# Patient Record
Sex: Female | Born: 1995 | Race: White | Hispanic: No | Marital: Single | Smoking: Current some day smoker
Health system: Southern US, Community
[De-identification: ages and names within clinical notes are randomized; demographics above are authoritative.]

## PROBLEM LIST (undated history)

## (undated) DIAGNOSIS — Z3490 Encounter for supervision of normal pregnancy, unspecified, unspecified trimester: Secondary | ICD-10-CM

## (undated) DIAGNOSIS — F32A Depression, unspecified: Secondary | ICD-10-CM

## (undated) DIAGNOSIS — F329 Major depressive disorder, single episode, unspecified: Secondary | ICD-10-CM

## (undated) DIAGNOSIS — D649 Anemia, unspecified: Secondary | ICD-10-CM

## (undated) DIAGNOSIS — F419 Anxiety disorder, unspecified: Secondary | ICD-10-CM

## (undated) HISTORY — DX: Depression, unspecified: F32.A

## (undated) HISTORY — PX: OTHER SURGICAL HISTORY: SHX169

## (undated) HISTORY — PX: HIP SURGERY: SHX245

## (undated) HISTORY — DX: Anemia, unspecified: D64.9

---
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
## 2015-01-25 DIAGNOSIS — M25559 Pain in unspecified hip: Secondary | ICD-10-CM | POA: Insufficient documentation

## 2015-01-25 DIAGNOSIS — S73191A Other sprain of right hip, initial encounter: Secondary | ICD-10-CM | POA: Insufficient documentation

## 2015-02-22 DIAGNOSIS — M5459 Other low back pain: Secondary | ICD-10-CM | POA: Insufficient documentation

## 2015-05-15 DIAGNOSIS — M21952 Unspecified acquired deformity of left thigh: Secondary | ICD-10-CM | POA: Insufficient documentation

## 2015-07-30 DIAGNOSIS — E669 Obesity, unspecified: Secondary | ICD-10-CM | POA: Insufficient documentation

## 2015-07-30 DIAGNOSIS — Z8701 Personal history of pneumonia (recurrent): Secondary | ICD-10-CM | POA: Insufficient documentation

## 2015-07-30 DIAGNOSIS — O99210 Obesity complicating pregnancy, unspecified trimester: Secondary | ICD-10-CM | POA: Insufficient documentation

## 2015-08-06 DIAGNOSIS — D649 Anemia, unspecified: Secondary | ICD-10-CM | POA: Insufficient documentation

## 2015-08-06 DIAGNOSIS — Z9889 Other specified postprocedural states: Secondary | ICD-10-CM | POA: Insufficient documentation

## 2015-12-25 DIAGNOSIS — F338 Other recurrent depressive disorders: Secondary | ICD-10-CM | POA: Insufficient documentation

## 2015-12-25 DIAGNOSIS — F4001 Agoraphobia with panic disorder: Secondary | ICD-10-CM | POA: Insufficient documentation

## 2017-10-21 LAB — HM PAP SMEAR: HM Pap smear: NORMAL

## 2017-10-25 DIAGNOSIS — O09899 Supervision of other high risk pregnancies, unspecified trimester: Secondary | ICD-10-CM | POA: Insufficient documentation

## 2017-10-25 DIAGNOSIS — F1210 Cannabis abuse, uncomplicated: Secondary | ICD-10-CM | POA: Insufficient documentation

## 2017-10-31 ENCOUNTER — Encounter: Payer: Self-pay | Admitting: Emergency Medicine

## 2017-10-31 ENCOUNTER — Emergency Department
Admission: EM | Admit: 2017-10-31 | Discharge: 2017-10-31 | Disposition: A | Discharge status: Home / Self Care | Payer: Medicaid Other | Attending: Emergency Medicine | Admitting: Emergency Medicine

## 2017-10-31 ENCOUNTER — Other Ambulatory Visit: Payer: Self-pay

## 2017-10-31 DIAGNOSIS — Z3A12 12 weeks gestation of pregnancy: Secondary | ICD-10-CM | POA: Diagnosis not present

## 2017-10-31 DIAGNOSIS — R109 Unspecified abdominal pain: Secondary | ICD-10-CM

## 2017-10-31 DIAGNOSIS — R1084 Generalized abdominal pain: Secondary | ICD-10-CM | POA: Diagnosis not present

## 2017-10-31 DIAGNOSIS — O9989 Other specified diseases and conditions complicating pregnancy, childbirth and the puerperium: Secondary | ICD-10-CM | POA: Diagnosis present

## 2017-10-31 DIAGNOSIS — O26891 Other specified pregnancy related conditions, first trimester: Secondary | ICD-10-CM

## 2017-10-31 DIAGNOSIS — Z87891 Personal history of nicotine dependence: Secondary | ICD-10-CM | POA: Insufficient documentation

## 2017-10-31 HISTORY — DX: Anxiety disorder, unspecified: F41.9

## 2017-10-31 NOTE — ED Triage Notes (Signed)
Patient states that she is having lower abdominal cramping that started earlier today. Patient states that she is about [redacted] weeks pregnant.

## 2017-10-31 NOTE — ED Notes (Signed)
Provider in with patient at this time to do an ultrasound.

## 2017-10-31 NOTE — ED Provider Notes (Signed)
[facility] Emergency Department Provider Note   ____________________________________________    I have reviewed the triage vital signs and the nursing notes.   HISTORY  Chief Complaint Abdominal Pain     HPI Mckenzie Obrien is a 22 y.o. female who presents with complaints of lower abdominal cramping.  Patient reports she is about [redacted] weeks pregnant, she recently moved to the area, has not reestablish OB/GYN care however she has had a normal ultrasound several weeks ago.  She denies vaginal bleeding.  No nausea or vomiting.  She reports after eating today she developed some mild cramping in her abdomen and she became concerned about the pregnancy.  She thinks it may have started in response to something that she ate   Past Medical History:  Diagnosis Date  . Anxiety     There are no active problems to display for this patient.   Past Surgical History:  Procedure Laterality Date  . HIP SURGERY Right     Prior to Admission medications   Not on File     Allergies Patient has no known allergies.  No family history on file.  Social History Social History   Tobacco Use  . Smoking status: Former Games developer  . Smokeless tobacco: Never Used  Substance Use Topics  . Alcohol use: Not Currently  . Drug use: Not Currently    Types: Marijuana, Cocaine    Review of Systems  Constitutional: No fever/chills Eyes: No visual changes.  ENT: No sore throat. Cardiovascular: Denies chest pain. Respiratory: Denies shortness of breath. Gastrointestinal: As above Genitourinary: Negative for dysuria.  Vaginal discharge no bleeding Musculoskeletal: Negative for back pain. Skin: Negative for rash. Neurological: Negative for headaches    ____________________________________________   PHYSICAL EXAM:  VITAL SIGNS: ED Triage Vitals  Enc Vitals Group     BP 10/31/17 2031 121/71     Pulse Rate 10/31/17 2030 78     Resp 10/31/17 2030 18   Temp 10/31/17 2030 98.1 F (36.7 C)     Temp Source 10/31/17 2030 Oral     SpO2 10/31/17 2030 98 %     Weight 10/31/17 2030 89.8 kg (198 lb)     Height 10/31/17 2030 1.651 m (5\' 5" )     Head Circumference --      Peak Flow --      Pain Score 10/31/17 2030 6     Pain Loc --      Pain Edu? --      Excl. in GC? --     Constitutional: Alert and oriented. No acute distress. Pleasant and interactive   Mouth/Throat: Mucous membranes are moist.    Cardiovascular: Normal rate, regular rhythm. Grossly normal heart sounds.  Good peripheral circulation. Respiratory: Normal respiratory effort.  No retractions. Lungs CTAB. Gastrointestinal: Soft and nontender. No distention.  No CVA tenderness.  Musculoskeletal:   Warm and well perfused Neurologic:  Normal speech and language. No gross focal neurologic deficits are appreciated.  Skin:  Skin is warm, dry and intact. No rash noted. Psychiatric: Mood and affect are normal. Speech and behavior are normal.  ____________________________________________   LABS (all labs ordered are listed, but only abnormal results are displayed)  Labs Reviewed - No data to display ____________________________________________  EKG  None ____________________________________________  RADIOLOGY  EMBU: 12-week 2-day pregnancy, normal heart rate, moving ____________________________________________   PROCEDURES  Procedure(s) performed: No  Procedures   Critical Care performed: No ____________________________________________   INITIAL IMPRESSION / ASSESSMENT  AND PLAN / ED COURSE  Pertinent labs & imaging results that were available during my care of the patient were reviewed by me and considered in my medical decision making (see chart for details).  Patient quite reassured by ultrasound performed in the emergency department, no indication for further testing at this time, she feels well and has no symptoms in the emergency department.  Follow-up  with OB    ____________________________________________   FINAL CLINICAL IMPRESSION(S) / ED DIAGNOSES  Final diagnoses:  Abdominal pain in pregnancy, first trimester        Note:  This document was prepared using Dragon voice recognition software and may include unintentional dictation errors.    Jene Every, MD 10/31/17 [phone]

## 2018-01-21 DIAGNOSIS — F1911 Other psychoactive substance abuse, in remission: Secondary | ICD-10-CM | POA: Insufficient documentation

## 2018-03-16 ENCOUNTER — Other Ambulatory Visit: Payer: Self-pay

## 2018-03-16 ENCOUNTER — Ambulatory Visit
Admission: EM | Admit: 2018-03-16 | Discharge: 2018-03-16 | Disposition: A | Discharge status: Home / Self Care | Payer: Medicaid Other | Attending: Family Medicine | Admitting: Family Medicine

## 2018-03-16 ENCOUNTER — Encounter: Payer: Self-pay | Admitting: Emergency Medicine

## 2018-03-16 DIAGNOSIS — J00 Acute nasopharyngitis [common cold]: Secondary | ICD-10-CM | POA: Insufficient documentation

## 2018-03-16 DIAGNOSIS — Z87891 Personal history of nicotine dependence: Secondary | ICD-10-CM | POA: Diagnosis not present

## 2018-03-16 DIAGNOSIS — H6502 Acute serous otitis media, left ear: Secondary | ICD-10-CM | POA: Insufficient documentation

## 2018-03-16 LAB — RAPID INFLUENZA A&B ANTIGENS (ARMC ONLY): INFLUENZA A (ARMC): NEGATIVE

## 2018-03-16 LAB — RAPID STREP SCREEN (MED CTR MEBANE ONLY): STREPTOCOCCUS, GROUP A SCREEN (DIRECT): NEGATIVE

## 2018-03-16 LAB — RAPID INFLUENZA A&B ANTIGENS: Influenza B (ARMC): NEGATIVE

## 2018-03-16 MED ORDER — AMOXICILLIN 875 MG PO TABS: 875.0000 mg | ORAL_TABLET | Freq: Two times a day (BID) | ORAL | 0 refills | Status: DC

## 2018-03-16 NOTE — ED Triage Notes (Signed)
Patient in today c/o sore throat and bilateral ear fullness and pain x 4 days. Patient had fever yesterday of 101.2. Patient is [redacted] weeks pregnant.

## 2018-03-16 NOTE — ED Provider Notes (Signed)
[facility]    CSN: [id number] Arrival date & time: 03/16/18  0818     History   Chief Complaint Chief Complaint  Patient presents with  . Sore Throat  . Otalgia    HPI Mckenzie Obrien is a 23 y.o. female.   The history is provided by the patient.  URI  Presenting symptoms: congestion, ear pain and sore throat   Severity:  Moderate Onset quality:  Sudden Duration:  4 days Timing:  Constant Progression:  Unchanged Chronicity:  New Relieved by:  None tried Ineffective treatments:  None tried Associated symptoms: no wheezing   Risk factors: sick contacts   Risk factors: no recent travel     Past Medical History:  Diagnosis Date  . Anxiety     There are no active problems to display for this patient.   Past Surgical History:  Procedure Laterality Date  . HIP SURGERY Right     OB History    Gravida  1   Para      Term      Preterm      AB      Living        SAB      TAB      Ectopic      Multiple      Live Births               Home Medications    Prior to Admission medications   Medication Sig Start Date End Date Taking? Authorizing Provider  Prenatal Vit-DSS-Fe Fum-FA (PRENATAL 19) tablet Take by mouth. 09/21/17  Yes [provider]  amoxicillin (AMOXIL) 875 MG tablet Take 1 tablet (875 mg total) by mouth 2 (two) times daily. 03/16/18   Payton Mccallum, MD    Family History Family History  Problem Relation Age of Onset  . Healthy Mother   . Heart attack Father 25    Social History Social History   Tobacco Use  . Smoking status: Former Smoker    Last attempt to quit: 07/15/2017    Years since quitting: 0.6  . Smokeless tobacco: Never Used  Substance Use Topics  . Alcohol use: Not Currently  . Drug use: Not Currently    Types: Marijuana, Cocaine     Allergies   Patient has no known allergies.   Review of Systems Review of Systems  HENT: Positive for congestion, ear pain and sore throat.     Respiratory: Negative for wheezing.      Physical Exam Triage Vital Signs ED Triage Vitals  Enc Vitals Group     BP 03/16/18 0831 124/63     Pulse Rate 03/16/18 0831 (!) 104     Resp 03/16/18 0831 18     Temp 03/16/18 0831 97.8 F (36.6 C)     Temp Source 03/16/18 0831 Oral     SpO2 03/16/18 0831 100 %     Weight 03/16/18 0832 216 lb (98 kg)     Height 03/16/18 0832 5\' 5"  (1.651 m)     Head Circumference --      Peak Flow --      Pain Score 03/16/18 0832 6     Pain Loc --      Pain Edu? --      Excl. in GC? --    No data found.  Updated Vital Signs BP 124/63 (BP Location: Left Arm)   Pulse (!) 104   Temp 97.8 F (36.6 C) (Oral)  Resp 18   Ht 5\' 5"  (1.651 m)   Wt 98 kg Comment: [redacted] weeks pregnant  LMP 08/10/2017 (Approximate)   SpO2 100%   BMI 35.94 kg/m   Visual Acuity Right Eye Distance:   Left Eye Distance:   Bilateral Distance:    Right Eye Near:   Left Eye Near:    Bilateral Near:     Physical Exam Vitals signs and nursing note reviewed.  Constitutional:      General: She is not in acute distress.    Appearance: She is well-developed. She is not diaphoretic.  HENT:     Head: Normocephalic and atraumatic.     Right Ear: Tympanic membrane, ear canal and external ear normal.     Left Ear: Ear canal and external ear normal. Tympanic membrane is erythematous and bulging.     Nose: Congestion and rhinorrhea present.     Mouth/Throat:     Pharynx: Uvula midline. No oropharyngeal exudate.  Neck:     Musculoskeletal: Normal range of motion and neck supple.     Thyroid: No thyromegaly.  Cardiovascular:     Rate and Rhythm: Normal rate and regular rhythm.     Heart sounds: Normal heart sounds.  Pulmonary:     Effort: Pulmonary effort is normal. No respiratory distress.     Breath sounds: Normal breath sounds. No stridor. No wheezing, rhonchi or rales.  Lymphadenopathy:     Cervical: No cervical adenopathy.      UC Treatments / Results  Labs (all  labs ordered are listed, but only abnormal results are displayed) Labs Reviewed  RAPID STREP SCREEN ([facility] ONLY)  RAPID INFLUENZA A&B ANTIGENS ([facility] ONLY)  CULTURE, GROUP A STREP [facility])    EKG None  Radiology No results found.  Procedures Procedures (including critical care time)  Medications Ordered in UC Medications - No data to display  Initial Impression / Assessment and Plan / UC Course  I have reviewed the triage vital signs and the nursing notes.  Pertinent labs & imaging results that were available during my care of the patient were reviewed by me and considered in my medical decision making (see chart for details).      Final Clinical Impressions(s) / UC Diagnoses   Final diagnoses:  Acute serous otitis media of left ear, recurrence not specified  Acute nasopharyngitis    ED Prescriptions    Medication Sig Dispense Auth. Provider   amoxicillin (AMOXIL) 875 MG tablet Take 1 tablet (875 mg total) by mouth 2 (two) times daily. 14 tablet Payton Mccallum, MD     1. Lab results and diagnosis reviewed with patient 2. rx as per orders above; reviewed possible side effects, interactions, risks and benefits  3. Recommend supportive treatment with otc tylenol prn, salt water gargles 4. Follow-up prn if symptoms worsen or don't improve  Controlled Substance Prescriptions [place] Controlled Substance Registry consulted? Not Applicable   Payton Mccallum, MD 03/16/18 [phone]

## 2018-03-18 LAB — CULTURE, GROUP A STREP (THRC)

## 2018-07-13 DIAGNOSIS — F53 Postpartum depression: Secondary | ICD-10-CM | POA: Insufficient documentation

## 2018-08-31 DIAGNOSIS — M25352 Other instability, left hip: Secondary | ICD-10-CM | POA: Insufficient documentation

## 2018-09-17 DIAGNOSIS — Z975 Presence of (intrauterine) contraceptive device: Secondary | ICD-10-CM | POA: Insufficient documentation

## 2018-11-21 DIAGNOSIS — K257 Chronic gastric ulcer without hemorrhage or perforation: Secondary | ICD-10-CM | POA: Insufficient documentation

## 2018-11-24 ENCOUNTER — Ambulatory Visit
Admission: EM | Admit: 2018-11-24 | Discharge: 2018-11-24 | Disposition: A | Discharge status: Home / Self Care | Payer: Medicaid Other | Attending: Family Medicine | Admitting: Family Medicine

## 2018-11-24 ENCOUNTER — Other Ambulatory Visit: Payer: Self-pay

## 2018-11-24 DIAGNOSIS — W504XXA Accidental scratch by another person, initial encounter: Secondary | ICD-10-CM | POA: Diagnosis not present

## 2018-11-24 DIAGNOSIS — S0591XA Unspecified injury of right eye and orbit, initial encounter: Secondary | ICD-10-CM

## 2018-11-24 MED ORDER — POLYMYXIN B-TRIMETHOPRIM 10000-0.1 UNIT/ML-% OP SOLN: 1.0000 [drp] | Freq: Four times a day (QID) | OPHTHALMIC | 0 refills | Status: AC

## 2018-11-24 MED ORDER — KETOROLAC TROMETHAMINE 0.5 % OP SOLN: 1.0000 [drp] | Freq: Four times a day (QID) | OPHTHALMIC | 0 refills | Status: AC | PRN

## 2018-11-24 NOTE — ED Triage Notes (Signed)
Pt. States her infant scratched her & she got her eye, ever since it has been inflamed and irritated/red, this happened at 7am this morning.

## 2018-11-24 NOTE — ED Provider Notes (Signed)
[facility]    CSN: [id number] Arrival date & time: 11/24/18  1620  History   Chief Complaint Chief Complaint  Patient presents with   Eye Problem   HPI  23 year old female presents with the above complaint.  Patient reports that her daughter scratched her right eye this morning.  She believes that she suffered a corneal abrasion.  She states that her eye is red and irritated.  It is painful, 6/10 in severity.  Mild photophobia.  No visual disturbance.  No medications or interventions tried.  No reports of discharge.  No other associated symptoms.  No other complaints.  PMH, Surgical Hx, Family Hx, Social History reviewed and updated as below.  PMH: Depression    Migraine  DIAGNOSED  Anxiety    Substance abuse (CMS-HCC)    Former smoker    Poor intravenous access  "tiny rolling veins"  GERD (gastroesophageal reflux disease)  no current medications  Pneumonia and influenza 08/02/2015 Finished ABX 03-08-2015  Anesthesia complication  c-sec . epidural " wore off quicky" : no problems with right POA surgery, 2 years ago  Anemia 08/27/2013     Surgical Hx: OSTEOTOMY PELVIS 08/06/2015 Hip/Right Procedure: OSTEOTOMY, ILIAC, ACETABULAR OR INNOMINATE BONE; Surgeon: Lily KocherBrian David Lewis, MD; Location: [facility] OR; Service: Orthopedics; Laterality: Right;  Medical devices from this surgery are in the Implants section.   ARTHROSCOPY HIP 08/06/2015 Hip/Right Procedure: ARTHROSCOPY, HIP, SURGICAL; WITH LABRAL REPAIR; Surgeon: Lily KocherBrian David Lewis, MD; [facility]; Service: Orthopedics; Laterality: Right;  Medical devices from this surgery are in the Implants section.   INTRAOPERATIVE FLUOROSCOPY 08/06/2015 Hip/Right Procedure: FLUOROSCOPY, PHYSICIAN OR OTHER QUALIFIED HEALTH CARE PROFESSIONAL TIME MORE THAN 1 HOUR, ASSISTING A NONRADIOLOGIC PHYSICIAN OR OTHER QUALIFIED HEALTH CARE PROFESSIONAL; Surgeon: Lily KocherBrian David Lewis, MD; [facility]; Service: Orthopedics;  Laterality: Right;  Medical devices from this surgery are in the Implants section.   OSTEOTOMY PELVIS 09/16/2018 Hip/Left Procedure: OSTEOTOMY, ILIAC, ACETABULAR OR INNOMINATE BONE; Surgeon: Lily KocherLewis, Brian David, MD; Location: [facility] OR; Service: Orthopedics; Laterality: Left;  Medical devices from this surgery are in the Implants section.   ARTHROSCOPY HIP 09/16/2018 Hip/Left Procedure: ARTHROSCOPY, HIP, SURGICAL; WITH FEMOROPLASTY (IE, TREATMENT OF CAM LESION); Surgeon: Lily KocherLewis, Brian David, MD; [facility]; Service: Orthopedics; Laterality: Left;  Medical devices from this surgery are in the Implants section.   ARTHROSCOPY HIP 09/16/2018 Hip/Left Procedure: ARTHROSCOPY, HIP, SURGICAL; Surgeon: Lily KocherLewis, Brian David, MD; [facility]; Service: Orthopedics; Laterality: Left;  Medical devices from this surgery are in the Implants section.   INTRAOPERATIVE FLUOROSCOPY 09/16/2018 Hip/Left Procedure: FLUOROSCOPY; Surgeon: Lily KocherLewis, Brian David, MD; [facility]; Service: Orthopedics; Laterality: Left;  Medical devices from this surgery are in the Implants section.   UNLISTED ARTHROSCOPY PROCEDURE 09/16/2018 Hip/Left Procedure: hip capsular plication; Surgeon: Lily KocherLewis, Brian David, MD; [facility]; Service: Orthopedics; Laterality: Left;  Medical devices from this surgery are in the Implants section.       Home Medications    Prior to Admission medications   Medication Sig Start Date End Date Taking? Authorizing Provider  ketorolac (ACULAR) 0.5 % ophthalmic solution Place 1 drop into the right eye 4 (four) times daily as needed for up to 5 days (Pain). 11/24/18 11/29/18  Tommie Samsook, Annalia Metzger G, DO  Prenatal Vit-DSS-Fe Fum-FA (PRENATAL 19) tablet Take by mouth. 09/21/17   [provider]  trimethoprim-polymyxin b (POLYTRIM) ophthalmic solution Place 1 drop into the right eye every 6 (six) hours for 5 days. 11/24/18 11/29/18  Tommie Samsook, Westyn Driggers G, DO  Family History Alcohol abuse Father Awilda [place]    Lung cancer Maternal Grandfather    Breast cancer Maternal Grandmother    Diabetes Maternal Grandmother    Heart disease Maternal Grandmother    No Known Problems Mother    Breast cancer Paternal Grandmother      Social History Social History   Tobacco Use   Smoking status: Former Smoker    Quit date: 07/15/2017    Years since quitting: 1.3   Smokeless tobacco: Never Used  Substance Use Topics   Alcohol use: Not Currently   Drug use: Not Currently    Types: Marijuana, Cocaine   Allergies   Abilify [aripiprazole]   Review of Systems Review of Systems  Constitutional: Negative.   Eyes: Positive for photophobia, pain and redness. Negative for discharge and visual disturbance.   Physical Exam Triage Vital Signs ED Triage Vitals  Enc Vitals Group     BP 11/24/18 1653 115/84     Pulse Rate 11/24/18 1653 81     Resp 11/24/18 1653 19     Temp 11/24/18 1653 98.4 F (36.9 C)     Temp Source 11/24/18 1653 Oral     SpO2 11/24/18 1653 99 %     Weight 11/24/18 1650 230 lb (104.3 kg)     Height --      Head Circumference --      Peak Flow --      Pain Score 11/24/18 1650 6     Pain Loc --      Pain Edu? --      Excl. in [place]? --    Updated Vital Signs BP 115/84 (BP Location: Right Arm)    Pulse 81    Temp 98.4 F (36.9 C) (Oral)    Resp 19    Wt 104.3 kg    LMP 07/08/2018    SpO2 99%    Breastfeeding No    BMI 38.27 kg/m   Visual Acuity Right Eye Distance:   Left Eye Distance:   Bilateral Distance:    Right Eye Near:   Left Eye Near:    Bilateral Near:     Physical Exam Vitals signs and nursing note reviewed.  Constitutional:      General: She is not in acute distress.    Appearance: Normal appearance. She is not ill-appearing.  HENT:     Head: Normocephalic and atraumatic.     Nose: Nose normal. No congestion.  Eyes:     General:        Right eye: No discharge.        Left eye: No discharge.     Comments: Right - conjunctival injection.  Fluorescein negative.   Cardiovascular:     Rate and Rhythm: Normal rate and regular rhythm.     Heart sounds: No murmur.  Pulmonary:     Effort: Pulmonary effort is normal. No respiratory distress.  Neurological:     Mental Status: She is alert.  Psychiatric:        Mood and Affect: Mood normal.        Behavior: Behavior normal.    UC Treatments / Results  Labs (all labs ordered are listed, but only abnormal results are displayed) Labs Reviewed - No data to display  EKG   Radiology No results found.  Procedures Procedures (including critical care time)  Medications Ordered in UC Medications - No data to display  Initial Impression / Assessment and Plan / UC Course  I have  reviewed the triage vital signs and the nursing notes.  Pertinent labs & imaging results that were available during my care of the patient were reviewed by me and considered in my medical decision making (see chart for details).    23 year old female presents with an eye injury.  No abrasion noted on exam.  Placing empirically on Polytrim and ketorolac eyedrops.  Supportive care.  Final Clinical Impressions(s) / UC Diagnoses   Final diagnoses:  Right eye injury, initial encounter     Discharge Instructions     Medication as prescribed.  Take care  Dr. Adriana Simas    ED Prescriptions    Medication Sig Dispense Auth. Provider   trimethoprim-polymyxin b (POLYTRIM) ophthalmic solution Place 1 drop into the right eye every 6 (six) hours for 5 days. 10 mL Maysel Mccolm G, DO   ketorolac (ACULAR) 0.5 % ophthalmic solution Place 1 drop into the right eye 4 (four) times daily as needed for up to 5 days (Pain). 5 mL Tommie Sams, DO     PDMP not reviewed this encounter.   Tommie Sams, [place] 11/24/18 [phone]

## 2018-11-24 NOTE — Discharge Instructions (Signed)
Medication as prescribed.  Take care  Dr. Shalen Petrak  

## 2018-12-23 ENCOUNTER — Ambulatory Visit: Payer: Medicaid Other | Admitting: Family Medicine

## 2018-12-23 ENCOUNTER — Other Ambulatory Visit: Payer: Self-pay | Admitting: Family Medicine

## 2019-01-24 DIAGNOSIS — O99345 Other mental disorders complicating the puerperium: Secondary | ICD-10-CM | POA: Insufficient documentation

## 2019-01-24 DIAGNOSIS — G4700 Insomnia, unspecified: Secondary | ICD-10-CM | POA: Insufficient documentation

## 2019-01-24 DIAGNOSIS — G8928 Other chronic postprocedural pain: Secondary | ICD-10-CM | POA: Insufficient documentation

## 2019-01-24 DIAGNOSIS — M25552 Pain in left hip: Secondary | ICD-10-CM | POA: Insufficient documentation

## 2019-01-27 ENCOUNTER — Ambulatory Visit (INDEPENDENT_AMBULATORY_CARE_PROVIDER_SITE_OTHER): Payer: Medicaid Other | Admitting: Family Medicine

## 2019-01-27 ENCOUNTER — Other Ambulatory Visit: Payer: Self-pay

## 2019-01-27 ENCOUNTER — Encounter: Payer: Self-pay | Admitting: Family Medicine

## 2019-01-27 VITALS — BP 120/62 | HR 84 | Ht 65.0 in | Wt 231.0 lb

## 2019-01-27 DIAGNOSIS — Z7689 Persons encountering health services in other specified circumstances: Secondary | ICD-10-CM

## 2019-01-27 NOTE — Progress Notes (Signed)
Date:  01/27/2019   Name:  Mckenzie Obrien   DOB:  06-11-1995   MRN:  [id number]   Chief Complaint: Establish Care (just needs a pcp)  Patient is a 24 year old female who presents for a establishment of care exam. The patient reports the following problems: gastric ulcer. Health maintenance has been reviewed up to date.   No results found for: CREATININE, BUN, NA, K, CL, CO2 No results found for: CHOL, HDL, LDLCALC, LDLDIRECT, TRIG, CHOLHDL No results found for: TSH No results found for: HGBA1C   Review of Systems  Constitutional: Negative.  Negative for chills, fatigue, fever and unexpected weight change.  HENT: Negative for congestion, ear discharge, ear pain, rhinorrhea, sinus pressure, sneezing and sore throat.   Eyes: Negative for photophobia, pain, discharge, redness and itching.  Respiratory: Negative for cough, shortness of breath, wheezing and stridor.   Gastrointestinal: Negative for abdominal distention, abdominal pain, blood in stool, constipation, diarrhea, nausea and vomiting.  Endocrine: Negative for cold intolerance, heat intolerance, polydipsia, polyphagia and polyuria.  Genitourinary: Negative for dysuria, flank pain, frequency, hematuria, menstrual problem, pelvic pain, urgency, vaginal bleeding and vaginal discharge.  Musculoskeletal: Negative for arthralgias, back pain and myalgias.  Skin: Negative for rash.  Allergic/Immunologic: Negative for environmental allergies and food allergies.  Neurological: Negative for dizziness, weakness, light-headedness, numbness and headaches.  Hematological: Negative for adenopathy. Does not bruise/bleed easily.  Psychiatric/Behavioral: Negative for dysphoric mood. The patient is not nervous/anxious.     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 01/24/2019   Insomnia 01/24/2019   Postpartum anxiety 01/24/2019   Chronic gastric ulcer  without hemorrhage and without perforation 11/21/2018   IUD (intrauterine device) in place 09/17/2018   Postpartum depression (CODE) 07/13/2018   History of substance abuse (HCC) 01/21/2018   Mild tetrahydrocannabinol (THC) abuse 10/25/2017   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   Anemia 08/06/2015   History of pneumonia 07/30/2015   Obesity (BMI 30.0-34.9) 07/30/2015   Acquired deformity of hip, left 05/15/2015   History of depression 07/19/2013   History of marijuana use 07/19/2013   History of migraine 07/19/2013    Allergies  Allergen Reactions   Abilify [Aripiprazole]     Past Surgical History:  Procedure Laterality Date   hip reconstruction Bilateral    R) at 24 yrs old and L) at 52   HIP SURGERY Right     Social History   Tobacco Use   Smoking status: Former Smoker    Types: Cigarettes    Quit date: 07/15/2017    Years since quitting: 1.5   Smokeless tobacco: Never Used  Substance Use Topics   Alcohol use: Not Currently   Drug use: Not Currently    Types: Marijuana, Cocaine     Medication list has been reviewed and updated.  Current Meds  Medication Sig   buPROPion (WELLBUTRIN XL) 300 MG 24 hr tablet Take 1 tablet by mouth daily. psych   citalopram (CELEXA) 20 MG tablet Take 1.5 tablets by mouth daily. psych   diphenhydrAMINE (BENADRYL) 25 mg capsule Take 25 mg by mouth as needed. otc   ferrous sulfate 325 (65 FE) MG tablet Take 1 tablet by mouth every other day.   ondansetron (ZOFRAN) 4 MG tablet Take 4 mg by mouth every 6 (six) hours as needed.   pantoprazole (PROTONIX) 40 MG tablet Take 1 tablet by mouth daily.   sucralfate (  CARAFATE) 1 g tablet Take 1 tablet by mouth 4 (four) times daily.   traZODone (DESYREL) 50 MG tablet Take 0.5 tablets by mouth daily. psych    PHQ 2/9 Scores 01/27/2019  PHQ - 2 Score 1  PHQ- 9 Score 11    BP Readings from Last 3 Encounters:  01/27/19 120/62  11/24/18 115/84  03/16/18 124/63      Physical Exam Vitals and nursing note reviewed.  Constitutional:      General: She is not in acute distress.    Appearance: She is not diaphoretic.  HENT:     Head: Normocephalic and atraumatic.     Right Ear: External ear normal.     Left Ear: External ear normal.     Nose: Nose normal.  Eyes:     General:        Right eye: No discharge.        Left eye: No discharge.     Conjunctiva/sclera: Conjunctivae normal.     Pupils: Pupils are equal, round, and reactive to light.  Neck:     Thyroid: No thyromegaly.     Vascular: No JVD.  Cardiovascular:     Rate and Rhythm: Normal rate and regular rhythm.     Heart sounds: Normal heart sounds. No murmur. No friction rub. No gallop.   Pulmonary:     Effort: Pulmonary effort is normal.     Breath sounds: Normal breath sounds. No wheezing or rhonchi.  Abdominal:     General: Bowel sounds are normal.     Palpations: Abdomen is soft. There is no mass.     Tenderness: There is no abdominal tenderness. There is no guarding.  Musculoskeletal:        General: Normal range of motion.     Cervical back: Normal range of motion and neck supple.  Lymphadenopathy:     Cervical: No cervical adenopathy.  Skin:    General: Skin is warm and dry.  Neurological:     Mental Status: She is alert.     Deep Tendon Reflexes: Reflexes are normal and symmetric.     Wt Readings from Last 3 Encounters:  01/27/19 231 lb (104.8 kg)  11/24/18 230 lb (104.3 kg)  03/16/18 216 lb (98 kg)    BP 120/62    Pulse 84    Ht 5\' 5"  (1.651 m)    Wt 231 lb (104.8 kg)    BMI 38.44 kg/m   Assessment and Plan:  1. Establishing care with new doctor, encounter for No subjective/objective concerns noted during history and physical exam.  Previous physician encounters have been reviewed.  Patient is not requiring any medications.  Patient is now established and will continue with her psychiatrist for her medications including evaluation and treatment of ADHD.

## 2019-01-27 NOTE — Patient Instructions (Signed)

## 2019-06-06 ENCOUNTER — Ambulatory Visit: Payer: Medicaid Other | Admitting: Family Medicine

## 2019-08-11 ENCOUNTER — Telehealth: Payer: Self-pay | Admitting: Family Medicine

## 2019-08-11 NOTE — Telephone Encounter (Signed)
I called the pt yesterday and left a message concerning she needs to get ADD meds from her psych. She was supposed to call y'all back and let you know if she needs to cancel appt. I am assuming that is why she is calling back, to cancel. Please call

## 2019-08-11 NOTE — Telephone Encounter (Signed)
Patient stated she got a call regarding her medication.  She stated that the doctor said that she could not prescribe the type of meds. That the patient needs.  Please advise and call patient to discuss at [phone]

## 2019-08-15 ENCOUNTER — Other Ambulatory Visit: Payer: Self-pay

## 2019-08-15 ENCOUNTER — Encounter: Payer: Self-pay | Admitting: Family Medicine

## 2019-08-15 ENCOUNTER — Ambulatory Visit (INDEPENDENT_AMBULATORY_CARE_PROVIDER_SITE_OTHER): Payer: Medicaid Other | Admitting: Family Medicine

## 2019-08-15 VITALS — BP 128/64 | HR 68 | Ht 65.0 in | Wt 201.0 lb

## 2019-08-15 DIAGNOSIS — F331 Major depressive disorder, recurrent, moderate: Secondary | ICD-10-CM | POA: Diagnosis not present

## 2019-08-15 DIAGNOSIS — F419 Anxiety disorder, unspecified: Secondary | ICD-10-CM

## 2019-08-15 DIAGNOSIS — F900 Attention-deficit hyperactivity disorder, predominantly inattentive type: Secondary | ICD-10-CM | POA: Diagnosis not present

## 2019-08-15 NOTE — Progress Notes (Signed)
Date:  08/15/2019   Name:  Mckenzie Obrien   DOB:  Dec 24, 1995   MRN:  [id number]   Chief Complaint: ADHD (Referral to psych. Wants to go back to [facility]. ), Depression (19 PHQ9), and Anxiety (14 GAD7 )  Depression        This is a recurrent problem.  The current episode started 1 to 4 weeks ago.   The problem has been gradually worsening since onset.  Associated symptoms include decreased concentration, fatigue, helplessness, hopelessness, insomnia, irritable, restlessness, decreased interest, appetite change, myalgias and sad.  Associated symptoms include no headaches and no suicidal ideas.  Past treatments include nothing.  Past medical history includes anxiety.   Anxiety Presents for follow-up visit. Symptoms include decreased concentration, depressed mood, excessive worry, insomnia, irritability, nervous/anxious behavior and restlessness. Patient reports no chest pain, compulsions, confusion, dizziness, feeling of choking, malaise, muscle tension, nausea, shortness of breath or suicidal ideas. Symptoms occur most days.      No results found for: CREATININE, BUN, NA, K, CL, CO2 No results found for: CHOL, HDL, LDLCALC, LDLDIRECT, TRIG, CHOLHDL No results found for: TSH No results found for: HGBA1C No results found for: WBC, HGB, HCT, MCV, PLT No results found for: ALT, AST, GGT, ALKPHOS, BILITOT   Review of Systems  Constitutional: Positive for appetite change, fatigue and irritability. Negative for chills, fever and unexpected weight change.  HENT: Negative for congestion, ear discharge, ear pain, rhinorrhea, sinus pressure, sneezing and sore throat.   Eyes: Negative for photophobia, pain, discharge, redness and itching.  Respiratory: Negative for cough, shortness of breath, wheezing and stridor.   Cardiovascular: Negative for chest pain.  Gastrointestinal: Negative for abdominal pain, blood in stool, constipation, diarrhea, nausea and vomiting.  Endocrine: Negative  for cold intolerance, heat intolerance, polydipsia, polyphagia and polyuria.  Genitourinary: Negative for dysuria, flank pain, frequency, hematuria, menstrual problem, pelvic pain, urgency, vaginal bleeding and vaginal discharge.  Musculoskeletal: Positive for myalgias. Negative for arthralgias and back pain.  Skin: Negative for rash.  Allergic/Immunologic: Negative for environmental allergies and food allergies.  Neurological: Negative for dizziness, weakness, light-headedness, numbness and headaches.  Hematological: Negative for adenopathy. Does not bruise/bleed easily.  Psychiatric/Behavioral: Positive for decreased concentration and depression. Negative for confusion, dysphoric mood and suicidal ideas. The patient is nervous/anxious and has insomnia.     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 01/24/2019   Insomnia 01/24/2019   Postpartum anxiety 01/24/2019   Chronic gastric ulcer without hemorrhage and without perforation 11/21/2018   IUD (intrauterine device) in place 09/17/2018   Postpartum depression (CODE) 07/13/2018   History of substance abuse (HCC) 01/21/2018   Mild tetrahydrocannabinol (THC) abuse 10/25/2017   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   Anemia 08/06/2015   History of pneumonia 07/30/2015   Obesity (BMI 30.0-34.9) 07/30/2015   Acquired deformity of hip, left 05/15/2015   History of depression 07/19/2013   History of marijuana use 07/19/2013   History of migraine 07/19/2013    Allergies  Allergen Reactions   Abilify [Aripiprazole]     Past Surgical History:  Procedure Laterality Date   hip reconstruction Bilateral    R) at 24 yrs old and L) at 69   HIP SURGERY Right     Social History   Tobacco Use   Smoking status: Former Smoker    Types: Cigarettes    Quit date: 07/15/2017    Years  since quitting: 2.0   Smokeless tobacco: Never Used    Vaping Use   Vaping Use: Never used  Substance Use Topics   Alcohol use: Not Currently   Drug use: Not Currently    Types: Marijuana, Cocaine     Medication list has been reviewed and updated.  Current Meds  Medication Sig   diphenhydrAMINE (BENADRYL) 25 mg capsule Take 25 mg by mouth as needed. otc   ibuprofen (ADVIL) 400 MG tablet Take 400 mg by mouth every 6 (six) hours as needed. Wisdom tooth removal    PHQ 2/9 Scores 08/15/2019 01/27/2019  PHQ - 2 Score 4 1  PHQ- 9 Score 19 11    GAD 7 : Generalized Anxiety Score 08/15/2019 01/27/2019  Nervous, Anxious, on Edge 2 3  Control/stop worrying 2 3  Worry too much - different things 2 3  Trouble relaxing 2 2  Restless 2 0  Easily annoyed or irritable 3 3  Afraid - awful might happen 1 3  Total GAD 7 Score 14 17  Anxiety Difficulty Somewhat difficult Somewhat difficult    BP Readings from Last 3 Encounters:  08/15/19 128/64  01/27/19 120/62  11/24/18 115/84    Physical Exam Constitutional:      General: She is irritable. She is not in acute distress.    Appearance: She is not diaphoretic.  HENT:     Head: Normocephalic and atraumatic.     Right Ear: External ear normal.     Left Ear: External ear normal.     Nose: Nose normal.  Eyes:     General:        Right eye: No discharge.        Left eye: No discharge.     Conjunctiva/sclera: Conjunctivae normal.     Pupils: Pupils are equal, round, and reactive to light.  Neck:     Thyroid: No thyromegaly.     Vascular: No JVD.  Cardiovascular:     Rate and Rhythm: Normal rate and regular rhythm.     Heart sounds: Normal heart sounds. No murmur heard.  No friction rub. No gallop.   Pulmonary:     Effort: Pulmonary effort is normal.     Breath sounds: Normal breath sounds. No wheezing or rhonchi.  Abdominal:     General: Bowel sounds are normal.     Palpations: Abdomen is soft. There is no mass.     Tenderness: There is no abdominal tenderness. There is no  guarding.  Musculoskeletal:        General: Normal range of motion.     Cervical back: Normal range of motion and neck supple.  Lymphadenopathy:     Cervical: No cervical adenopathy.  Skin:    General: Skin is warm and dry.  Neurological:     Mental Status: She is alert.     Deep Tendon Reflexes: Reflexes are normal and symmetric.     Wt Readings from Last 3 Encounters:  08/15/19 201 lb (91.2 kg)  01/27/19 231 lb (104.8 kg)  11/24/18 230 lb (104.3 kg)    BP 128/64    Pulse 68    Ht 5\' 5"  (1.651 m)    Wt 201 lb (91.2 kg)    SpO2 98%    BMI 33.45 kg/m   Assessment and Plan:  1. Moderate episode of recurrent major depressive disorder (HCC) Chronic.  Uncontrolled.  Stable.  PHQ 19.  Patient is referred to psychiatry for evaluation and treatment. - Ambulatory referral  to Psychiatry  2. Anxiety Chronic.  Uncontrolled.  Stable.  Gad score is 14.  Patient is referred to psychiatry for evaluation and treatment. - Ambulatory referral to Psychiatry  3. Attention deficit hyperactivity disorder (ADHD), predominantly inattentive type Chronic.  Uncontrolled.  Patient used to be on Adderall 15 mg.  Patient will be referred to psychiatry for evaluation and treatment if necessary. - Ambulatory referral to Psychiatry

## 2020-04-02 ENCOUNTER — Ambulatory Visit
Admission: EM | Admit: 2020-04-02 | Discharge: 2020-04-02 | Disposition: A | Discharge status: Home / Self Care | Payer: Medicaid Other

## 2020-04-02 ENCOUNTER — Other Ambulatory Visit: Payer: Self-pay

## 2020-04-02 DIAGNOSIS — H66003 Acute suppurative otitis media without spontaneous rupture of ear drum, bilateral: Secondary | ICD-10-CM | POA: Diagnosis not present

## 2020-04-02 DIAGNOSIS — J069 Acute upper respiratory infection, unspecified: Secondary | ICD-10-CM | POA: Diagnosis not present

## 2020-04-02 MED ORDER — AMOXICILLIN-POT CLAVULANATE 875-125 MG PO TABS: 1.0000 | ORAL_TABLET | Freq: Two times a day (BID) | ORAL | 0 refills | Status: AC

## 2020-04-02 MED ORDER — BENZONATATE 100 MG PO CAPS: 200.0000 mg | ORAL_CAPSULE | Freq: Three times a day (TID) | ORAL | 0 refills | Status: DC

## 2020-04-02 MED ORDER — PROMETHAZINE-DM 6.25-15 MG/5ML PO SYRP: 5.0000 mL | ORAL_SOLUTION | Freq: Four times a day (QID) | ORAL | 0 refills | Status: DC | PRN

## 2020-04-02 MED ORDER — IPRATROPIUM BROMIDE 0.06 % NA SOLN: 2.0000 | Freq: Four times a day (QID) | NASAL | 12 refills | Status: DC

## 2020-04-02 NOTE — ED Provider Notes (Signed)
[facility]    CSN: [id number] Arrival date & time: 04/02/20  1710      History   Chief Complaint Chief Complaint  Patient presents with  . Cough  . Otalgia    HPI Mckenzie Obrien is a 25 y.o. female.   HPI   25 year old female here for evaluation of cough, nasal congestion, and ear fullness.  Patient reports that she has been experiencing ED symptoms for the last several days.  She has had a runny nose with clear discharge and significant nasal congestion.  Patient denies fever, sore throat, headache, wheezing, or GI complaints.  Past Medical History:  Diagnosis Date  . Anemia   . Anxiety   . Depression     Patient Active Problem List   Diagnosis Date Noted  . 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 Insomnia 01/24/2019  . Postpartum anxiety 01/24/2019  . Chronic gastric ulcer without hemorrhage and without perforation 11/21/2018  . IUD (intrauterine device) in place 09/17/2018  . Postpartum depression (CODE) 07/13/2018  . History of substance abuse (HCC) 01/21/2018  . Mild tetrahydrocannabinol (THC) abuse 10/25/2017  . 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  . Anemia 08/06/2015  . History of pneumonia 07/30/2015  . Obesity (BMI 30.0-34.9) 07/30/2015  . Acquired deformity of hip, left 05/15/2015  . History of depression 07/19/2013  . History of marijuana use 07/19/2013  . History of migraine 07/19/2013    Past Surgical History:  Procedure Laterality Date  . hip reconstruction Bilateral    R) at 25 yrs old and L) at 89  . HIP SURGERY Right     OB History    Gravida  1   Para      Term      Preterm      AB      Living        SAB      IAB      Ectopic      Multiple      Live Births               Home Medications    Prior to Admission medications   Medication Sig Start Date End Date Taking? Authorizing Provider  AKLIEF 0.005 % CREA Apply 1 application topically  at bedtime. 02/10/20  Yes [provider]  amoxicillin-clavulanate (AUGMENTIN) 875-125 MG tablet Take 1 tablet by mouth every 12 (twelve) hours for 10 days. 04/02/20 04/12/20 Yes Becky Augusta, NP  benzonatate (TESSALON) 100 MG capsule Take 2 capsules (200 mg total) by mouth every 8 (eight) hours. 04/02/20  Yes Becky Augusta, NP  DORYX MPC 120 MG TBEC Take 1 tablet by mouth daily. 03/15/20  Yes [provider]  ipratropium (ATROVENT) 0.06 % nasal spray Place 2 sprays into both nostrils 4 (four) times daily. 04/02/20  Yes Becky Augusta, NP  promethazine-dextromethorphan (PROMETHAZINE-DM) 6.25-15 MG/5ML syrup Take 5 mLs by mouth 4 (four) times daily as needed. 04/02/20  Yes Becky Augusta, NP  spironolactone (ALDACTONE) 100 MG tablet Take 100 mg by mouth daily. 02/09/20  Yes [provider]  WINLEVI 1 % CREA 1(ONE) APPLICATION(S) TOPICAL EVERY NIGHT AT BEDTIME APPLY FIRST 03/29/20  Yes [provider]  diphenhydrAMINE (BENADRYL) 25 mg capsule Take 25 mg by mouth as needed. otc    [provider]  ibuprofen (ADVIL) 400 MG tablet Take 400 mg by mouth every 6 (six) hours as needed. Wisdom tooth removal    [provider]    Family History Family History  Problem Relation Age of Onset  . Healthy Mother   . Heart attack Father 29  . Diabetes Maternal Grandmother   . Heart disease Maternal Grandmother   . Stroke Maternal Grandfather   . Heart disease Brother     Social History Social History   Tobacco Use  . Smoking status: Former Smoker    Types: Cigarettes    Quit date: 07/15/2017    Years since quitting: 2.7  . Smokeless tobacco: Never Used  Vaping Use  . Vaping Use: Never used  Substance Use Topics  . Alcohol use: Not Currently  . Drug use: Not Currently    Types: Marijuana, Cocaine     Allergies   Abilify [aripiprazole]   Review of Systems Review of Systems  Constitutional: Negative for activity change, appetite change and fever.   HENT: Positive for congestion, ear pain and rhinorrhea. Negative for ear discharge and sore throat.   Respiratory: Positive for cough. Negative for shortness of breath and wheezing.   Gastrointestinal: Negative for diarrhea, nausea and vomiting.  Musculoskeletal: Negative for arthralgias and myalgias.  Hematological: Negative.   Psychiatric/Behavioral: Negative.      Physical Exam Triage Vital Signs ED Triage Vitals  Enc Vitals Group     BP 04/02/20 1747 117/76     Pulse Rate 04/02/20 1747 84     Resp 04/02/20 1747 18     Temp 04/02/20 1747 97.8 F (36.6 C)     Temp Source 04/02/20 1747 Oral     SpO2 04/02/20 1747 100 %     Weight 04/02/20 1745 220 lb (99.8 kg)     Height 04/02/20 1745 5\' 5"  (1.651 m)     Head Circumference --      Peak Flow --      Pain Score 04/02/20 1744 7     Pain Loc --      Pain Edu? --      Excl. in GC? --    No data found.  Updated Vital Signs BP 117/76 (BP Location: Left Arm)   Pulse 84   Temp 97.8 F (36.6 C) (Oral)   Resp 18   Ht 5\' 5"  (1.651 m)   Wt 220 lb (99.8 kg)   SpO2 100%   BMI 36.61 kg/m   Visual Acuity Right Eye Distance:   Left Eye Distance:   Bilateral Distance:    Right Eye Near:   Left Eye Near:    Bilateral Near:     Physical Exam Vitals and nursing note reviewed.  Constitutional:      General: She is not in acute distress.    Appearance: Normal appearance. She is not ill-appearing.  HENT:     Head: Normocephalic and atraumatic.     Right Ear: Ear canal and external ear normal.     Left Ear: Ear canal and external ear normal.     Nose: Congestion present.     Mouth/Throat:     Mouth: Mucous membranes are moist.     Pharynx: Oropharynx is clear. No posterior oropharyngeal erythema.  Cardiovascular:     Rate and Rhythm: Normal rate and regular rhythm.     Pulses: Normal pulses.     Heart sounds: Normal heart sounds. No murmur heard. No gallop.   Pulmonary:     Effort: Pulmonary effort is normal.      Breath sounds: Normal breath sounds. No wheezing, rhonchi or rales.  Skin:  General: Skin is warm and dry.     Capillary Refill: Capillary refill takes less than 2 seconds.     Findings: No erythema or rash.  Neurological:     General: No focal deficit present.     Mental Status: She is alert and oriented to person, place, and time.  Psychiatric:        Mood and Affect: Mood normal.        Behavior: Behavior normal.        Thought Content: Thought content normal.        Judgment: Judgment normal.      UC Treatments / Results  Labs (all labs ordered are listed, but only abnormal results are displayed) Labs Reviewed - No data to display  EKG   Radiology No results found.  Procedures Procedures (including critical care time)  Medications Ordered in UC Medications - No data to display  Initial Impression / Assessment and Plan / UC Course  I have reviewed the triage vital signs and the nursing notes.  Pertinent labs & imaging results that were available during my care of the patient were reviewed by me and considered in my medical decision making (see chart for details).   Pleasant 25 year old female here for evaluation of cold symptoms that started 2 to 3 days ago.  Physical exam reveals bilateral erythematous and injected tympanic membranes with clear external auditory canals.  Nasal mucosa is erythematous edematous with copious clear nasal discharge.  Posterior oropharynx is unremarkable.  Lungs are clear to auscultation all fields.  Patient's physical exam is consistent with upper respiratory infection and bilateral otitis media.  Will treat otitis media with Augmentin twice daily for 10 days, provide ipratropium nasal spray for nasal congestion and runny nose, Tessalon Perles for cough during the day and Promethazine DM for cough at bedtime.     Final Clinical Impressions(s) / UC Diagnoses   Final diagnoses:  Viral URI with cough  Non-recurrent acute suppurative  otitis media of both ears without spontaneous rupture of tympanic membranes     Discharge Instructions     The Augmentin twice daily for 10 days for treatment of ear infections.  Take this with food.  Use the ipratropium nasal spray, 2 squirts in each nostril every 6 hours, as needed for nasal congestion runny nose.  Use the Tessalon Perles during the day for cough and the Promethazine DM at bedtime for cough and congestion.  Return for reevaluation for new or worsening symptoms.    ED Prescriptions    Medication Sig Dispense Auth. Provider   amoxicillin-clavulanate (AUGMENTIN) 875-125 MG tablet Take 1 tablet by mouth every 12 (twelve) hours for 10 days. 20 tablet Becky Augusta, NP   ipratropium (ATROVENT) 0.06 % nasal spray Place 2 sprays into both nostrils 4 (four) times daily. 15 mL Becky Augusta, NP   benzonatate (TESSALON) 100 MG capsule Take 2 capsules (200 mg total) by mouth every 8 (eight) hours. 21 capsule Becky Augusta, NP   promethazine-dextromethorphan (PROMETHAZINE-DM) 6.25-15 MG/5ML syrup Take 5 mLs by mouth 4 (four) times daily as needed. 118 mL Becky Augusta, NP     PDMP not reviewed this encounter.   Becky Augusta, NP 04/02/20 [phone]

## 2020-04-02 NOTE — ED Triage Notes (Signed)
Pt c/o cough, nasal congestion, ear congestion (R worse) and pain for several days. Pt denies f/n/v/d or other symptoms. She has used nasal sprays and cold/flu medications with little improvement.

## 2020-04-02 NOTE — Discharge Instructions (Addendum)
The Augmentin twice daily for 10 days for treatment of ear infections.  Take this with food.  Use the ipratropium nasal spray, 2 squirts in each nostril every 6 hours, as needed for nasal congestion runny nose.  Use the Tessalon Perles during the day for cough and the Promethazine DM at bedtime for cough and congestion.  Return for reevaluation for new or worsening symptoms.

## 2020-04-04 ENCOUNTER — Ambulatory Visit
Admission: EM | Admit: 2020-04-04 | Discharge: 2020-04-04 | Disposition: A | Discharge status: Home / Self Care | Payer: Medicaid Other | Attending: Physician Assistant | Admitting: Physician Assistant

## 2020-04-04 ENCOUNTER — Other Ambulatory Visit: Payer: Self-pay

## 2020-04-04 DIAGNOSIS — S0502XA Injury of conjunctiva and corneal abrasion without foreign body, left eye, initial encounter: Secondary | ICD-10-CM

## 2020-04-04 DIAGNOSIS — H5712 Ocular pain, left eye: Secondary | ICD-10-CM

## 2020-04-04 MED ORDER — CIPROFLOXACIN HCL 0.3 % OP SOLN: 2.0000 [drp] | OPHTHALMIC | 0 refills | Status: AC

## 2020-04-04 MED ORDER — KETOROLAC TROMETHAMINE 0.5 % OP SOLN: 1.0000 [drp] | Freq: Four times a day (QID) | OPHTHALMIC | 0 refills | Status: AC

## 2020-04-04 NOTE — ED Provider Notes (Signed)
[facility]    CSN: [id number] Arrival date & time: 04/04/20  1441      History   Chief Complaint Chief Complaint  Patient presents with  . Eye Pain    HPI Mckenzie Obrien is a 25 y.o. female presenting for left eye pain since yesterday.  Patient states that her 11-year-old scratched her in the eye.  She has had left-sided eye redness, watering of the eye, photophobia since.  Patient wears glasses occasionally and denies use of contacts.  She has not really had any change in vision but says that she is not really been keeping her eye open since she was sensitive to the light.  Denies any headaches, dizziness, nausea/vomiting.  Has not applied any over-the-counter eyedrops or take anything for pain relief.  No other concerns.  HPI  Past Medical History:  Diagnosis Date  . Anemia   . Anxiety   . Depression     Patient Active Problem List   Diagnosis Date Noted  . 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 Insomnia 01/24/2019  . Postpartum anxiety 01/24/2019  . Chronic gastric ulcer without hemorrhage and without perforation 11/21/2018  . IUD (intrauterine device) in place 09/17/2018  . Postpartum depression (CODE) 07/13/2018  . History of substance abuse (HCC) 01/21/2018  . Mild tetrahydrocannabinol (THC) abuse 10/25/2017  . 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  . Anemia 08/06/2015  . History of pneumonia 07/30/2015  . Obesity (BMI 30.0-34.9) 07/30/2015  . Acquired deformity of hip, left 05/15/2015  . History of depression 07/19/2013  . History of marijuana use 07/19/2013  . History of migraine 07/19/2013    Past Surgical History:  Procedure Laterality Date  . hip reconstruction Bilateral    R) at 25 yrs old and L) at 35  . HIP SURGERY Right     OB History    Gravida  1   Para      Term      Preterm      AB      Living        SAB      IAB      Ectopic      Multiple      Live  Births               Home Medications    Prior to Admission medications   Medication Sig Start Date End Date Taking? Authorizing Provider  ciprofloxacin (CILOXAN) 0.3 % ophthalmic solution Place 2 drops into the left eye every 4 (four) hours while awake for 5 days. Administer 1 drop, every 2 hours, while awake, for 2 days. Then 1 drop, every 4 hours, while awake, for the next 5 days. 04/04/20 04/09/20 Yes Shirlee Latch, PA-C  ketorolac (ACULAR) 0.5 % ophthalmic solution Place 1 drop into the left eye every 6 (six) hours for 5 days. 04/04/20 04/09/20 Yes Eusebio Friendly B, PA-C  AKLIEF 0.005 % CREA Apply 1 application topically at bedtime. 02/10/20   [provider]  amoxicillin-clavulanate (AUGMENTIN) 875-125 MG tablet Take 1 tablet by mouth every 12 (twelve) hours for 10 days. 04/02/20 04/12/20  Becky Augusta, NP  benzonatate (TESSALON) 100 MG capsule Take 2 capsules (200 mg total) by mouth every 8 (eight) hours. 04/02/20   Becky Augusta, NP  diphenhydrAMINE (BENADRYL) 25 mg capsule Take 25 mg by mouth as needed. otc    [provider]  DORYX MPC 120 MG TBEC Take 1  tablet by mouth daily. 03/15/20   [provider]  ibuprofen (ADVIL) 400 MG tablet Take 400 mg by mouth every 6 (six) hours as needed. Wisdom tooth removal    [provider]  ipratropium (ATROVENT) 0.06 % nasal spray Place 2 sprays into both nostrils 4 (four) times daily. 04/02/20   Becky Augusta, NP  promethazine-dextromethorphan (PROMETHAZINE-DM) 6.25-15 MG/5ML syrup Take 5 mLs by mouth 4 (four) times daily as needed. 04/02/20   Becky Augusta, NP  spironolactone (ALDACTONE) 100 MG tablet Take 100 mg by mouth daily. 02/09/20   [provider]  WINLEVI 1 % CREA 1(ONE) APPLICATION(S) TOPICAL EVERY NIGHT AT BEDTIME APPLY FIRST 03/29/20   [provider]    Family History Family History  Problem Relation Age of Onset  . Healthy Mother   . Heart attack Father 79  . Diabetes Maternal  Grandmother   . Heart disease Maternal Grandmother   . Stroke Maternal Grandfather   . Heart disease Brother     Social History Social History   Tobacco Use  . Smoking status: Former Smoker    Types: Cigarettes    Quit date: 07/15/2017    Years since quitting: 2.7  . Smokeless tobacco: Never Used  Vaping Use  . Vaping Use: Never used  Substance Use Topics  . Alcohol use: Yes    Comment: occ  . Drug use: Not Currently    Types: Marijuana, Cocaine     Allergies   Abilify [aripiprazole]   Review of Systems Review of Systems  Eyes: Positive for photophobia, pain, redness and visual disturbance. Negative for discharge and itching.  Gastrointestinal: Negative for nausea and vomiting.  Skin: Negative for wound.  Neurological: Negative for dizziness, weakness, light-headedness and headaches.     Physical Exam Triage Vital Signs ED Triage Vitals  Enc Vitals Group     BP 04/04/20 1452 122/82     Pulse Rate 04/04/20 1452 92     Resp 04/04/20 1452 18     Temp 04/04/20 1452 98.5 F (36.9 C)     Temp Source 04/04/20 1452 Oral     SpO2 04/04/20 1452 98 %     Weight 04/04/20 1453 220 lb (99.8 kg)     Height 04/04/20 1453 5\' 5"  (1.651 m)     Head Circumference --      Peak Flow --      Pain Score 04/04/20 1452 4     Pain Loc --      Pain Edu? --      Excl. in GC? --    No data found.  Updated Vital Signs BP 122/82   Pulse 92   Temp 98.5 F (36.9 C) (Oral)   Resp 18   Ht 5\' 5"  (1.651 m)   Wt 220 lb (99.8 kg)   SpO2 98%   BMI 36.61 kg/m   Visual Acuity Right Eye Distance: 20/25 Left Eye Distance: 20/40 Bilateral Distance: 20/30  Right Eye Near:   Left Eye Near:    Bilateral Near:     Physical Exam Vitals and nursing note reviewed.  Constitutional:      General: She is not in acute distress.    Appearance: Normal appearance. She is not ill-appearing or toxic-appearing.  HENT:     Head: Normocephalic and atraumatic.     Nose: Nose normal.      Mouth/Throat:     Mouth: Mucous membranes are moist.     Pharynx: Oropharynx is clear.  Eyes:  General: Lids are normal. No scleral icterus.       Right eye: No discharge.        Left eye: No discharge.     Conjunctiva/sclera:     Left eye: Left conjunctiva is injected.     Pupils: Pupils are equal, round, and reactive to light.     Left eye: Corneal abrasion (small corneal abrasion ~12 o'clock) present.     Slit lamp exam:    Left eye: Photophobia present.  Cardiovascular:     Rate and Rhythm: Normal rate and regular rhythm.  Pulmonary:     Effort: Pulmonary effort is normal. No respiratory distress.  Musculoskeletal:     Cervical back: Neck supple.  Skin:    General: Skin is dry.  Neurological:     General: No focal deficit present.     Mental Status: She is alert. Mental status is at baseline.     Motor: No weakness.     Gait: Gait normal.  Psychiatric:        Mood and Affect: Mood normal.        Behavior: Behavior normal.        Thought Content: Thought content normal.      UC Treatments / Results  Labs (all labs ordered are listed, but only abnormal results are displayed) Labs Reviewed - No data to display  EKG   Radiology No results found.  Procedures Procedures (including critical care time)  Medications Ordered in UC Medications - No data to display  Initial Impression / Assessment and Plan / UC Course  I have reviewed the triage vital signs and the nursing notes.  Pertinent labs & imaging results that were available during my care of the patient were reviewed by me and considered in my medical decision making (see chart for details).   25 y/o female presenting for left eye pain, redness, photophobia following a scratch of the eye by her 25 year old.   Exam reveals corneal abrasion. Treating supportively with Ciloxan and ketorolac ophthalmic solution. Return and ED precautions discussed.    Final Clinical Impressions(s) / UC Diagnoses   Final  diagnoses:  Abrasion of left cornea, initial encounter  Pain of left eye     Discharge Instructions     You have a small abrasion of your cornea.  I have prescribed an antibiotic eyedrop to help prevent infection but if you notice any discolored drainage from the eye or increased eye pain you need to be seen again, preferably by an eye specialist.  I have sent ketorolac eyedrops.  This will help with pain relief.  You can also take oral ibuprofen and/or Tylenol.  Supportive care.  If the light bothers her eyes and keep the lights dim or worse then changed.  Follow-up with [place] as needed.    ED Prescriptions    Medication Sig Dispense Auth. Provider   ciprofloxacin (CILOXAN) 0.3 % ophthalmic solution Place 2 drops into the left eye every 4 (four) hours while awake for 5 days. Administer 1 drop, every 2 hours, while awake, for 2 days. Then 1 drop, every 4 hours, while awake, for the next 5 days. 5 mL Eusebio Friendly B, PA-C   ketorolac (ACULAR) 0.5 % ophthalmic solution Place 1 drop into the left eye every 6 (six) hours for 5 days. 5 mL Shirlee Latch, PA-C     PDMP not reviewed this encounter.   Shirlee Latch, PA-C 04/04/20 1621

## 2020-04-04 NOTE — Discharge Instructions (Signed)
You have a small abrasion of your cornea.  I have prescribed an antibiotic eyedrop to help prevent infection but if you notice any discolored drainage from the eye or increased eye pain you need to be seen again, preferably by an eye specialist.  I have sent ketorolac eyedrops.  This will help with pain relief.  You can also take oral ibuprofen and/or Tylenol.  Supportive care.  If the light bothers her eyes and keep the lights dim or worse then changed.  Follow-up with [place] as needed.

## 2020-04-04 NOTE — ED Triage Notes (Signed)
Pt c/o L eye pain and swelling. sts daughter scratched her in the eye last night. sts there has been some draining from eye.

## 2020-06-14 ENCOUNTER — Ambulatory Visit: Payer: Medicaid Other | Admitting: Family Medicine

## 2020-06-15 ENCOUNTER — Other Ambulatory Visit: Payer: Self-pay | Admitting: Otolaryngology

## 2020-06-15 DIAGNOSIS — H903 Sensorineural hearing loss, bilateral: Secondary | ICD-10-CM

## 2020-06-15 DIAGNOSIS — H93A2 Pulsatile tinnitus, left ear: Secondary | ICD-10-CM

## 2020-06-20 ENCOUNTER — Ambulatory Visit: Payer: Medicaid Other | Admitting: Family Medicine

## 2020-06-28 ENCOUNTER — Ambulatory Visit
Admission: RE | Admit: 2020-06-28 | Discharge: 2020-06-28 | Disposition: A | Discharge status: Home / Self Care | Payer: Medicaid Other | Source: Ambulatory Visit | Attending: Otolaryngology | Admitting: Otolaryngology

## 2020-06-28 ENCOUNTER — Other Ambulatory Visit: Payer: Self-pay

## 2020-06-28 DIAGNOSIS — H93A2 Pulsatile tinnitus, left ear: Secondary | ICD-10-CM | POA: Diagnosis not present

## 2020-06-28 DIAGNOSIS — H903 Sensorineural hearing loss, bilateral: Secondary | ICD-10-CM | POA: Insufficient documentation

## 2020-06-28 IMAGING — MR MR BRAIN/IAC WO/W CM
10 of 14 series · 28 of 48 positions shown · IV contrast (gadavist)
Comparison: No pertinent prior exams available for comparison.

CLINICAL DATA: Pulsatile tinnitus of left ear. Sensory hearing
loss, bilateral. Additional history provided by scanning
technologist: Patient reports constant "whooshing" in left ear for 4
months following ear/sinus infection, decreased hearing in left ear.

EXAM:
MRI HEAD WITHOUT AND WITH CONTRAST
TECHNIQUE: Multiplanar, multiecho pulse sequences of the brain and surrounding
structures were obtained without and with intravenous contrast.
CONTRAST:  10mL GADAVIST GADOBUTROL 1 MMOL/ML IV SOLN

[Series 5: T1 · sagittal · 5.0mm · 0.62mm/px · 3 of 25 slices shown (1 of 3)]
[im 1/25]
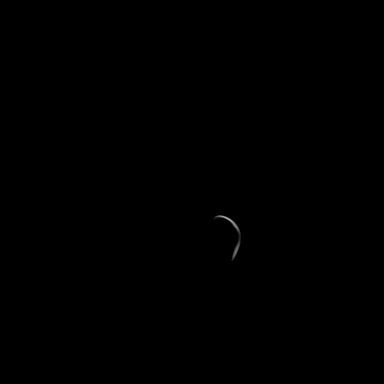
[im 13/25]
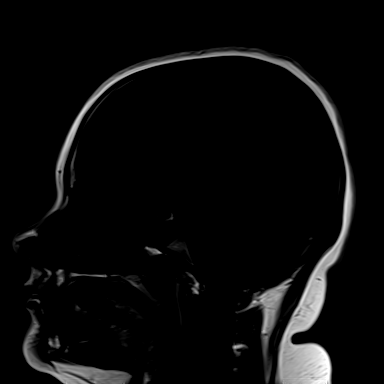
[im 25/25]
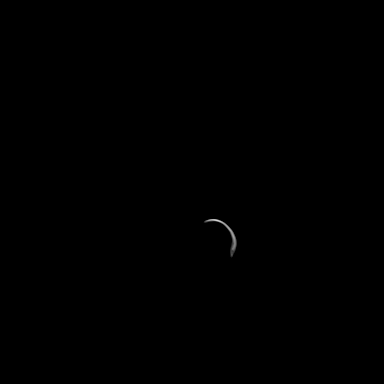

[Series 6: ax dwi_tracew · axial · 3.0mm · 0.65mm/px · z∈[-138,+11]mm · 3 of 48 slices shown]
[im 1/48]
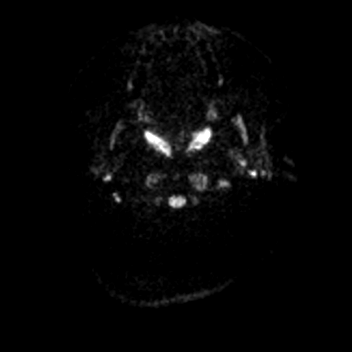
[im 24/48]
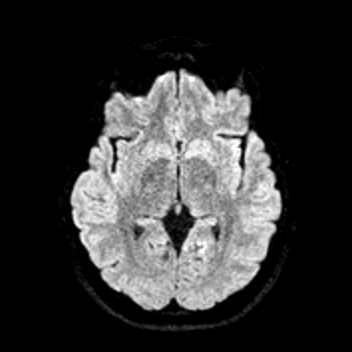
[im 48/48]
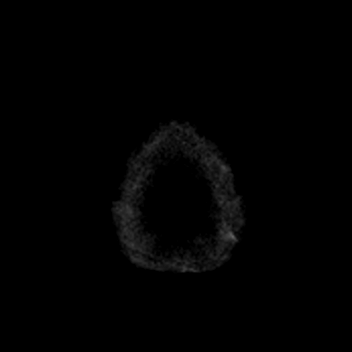

[Series 7: ax dwi_adc · axial · 3.0mm · 0.65mm/px · z∈[-138,+11]mm · 3 of 48 slices shown]
[im 1/48]
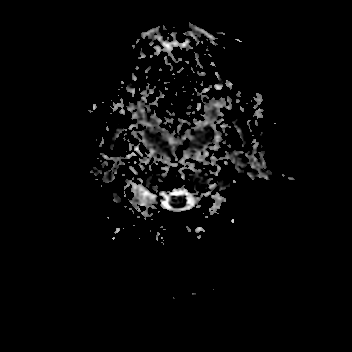
[im 24/48]
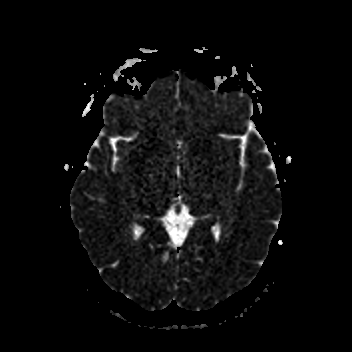
[im 48/48]
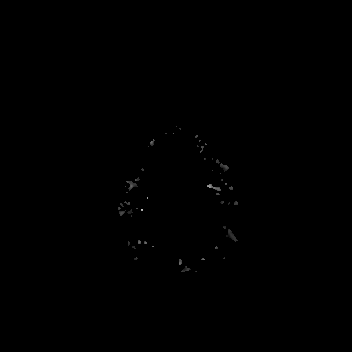

[Series 8: T2 · axial · 5.0mm · 0.53mm/px · z∈[-137,+13]mm · 2 of 27 slices shown]
[im 1/27]
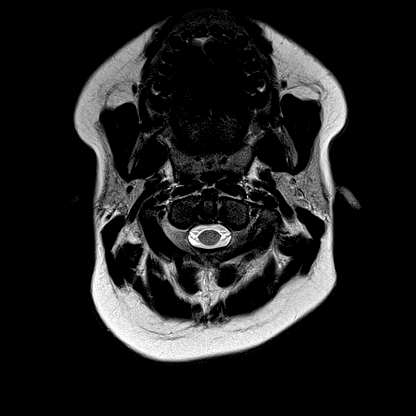
[im 27/27]
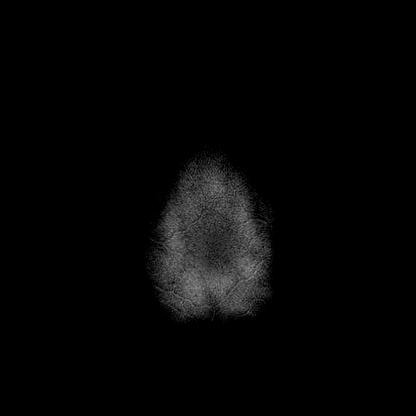

[Series 13: FLAIR · axial · 3.0mm · 0.53mm/px · z∈[-138,+17]mm · 4 of 55 slices shown]
[im 1/55]
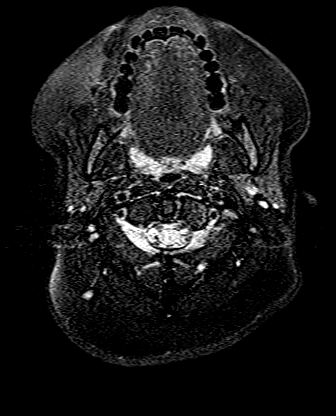
[im 19/55]
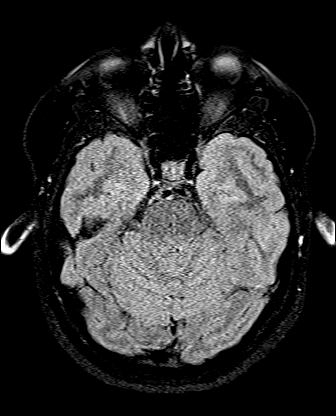
[im 37/55]
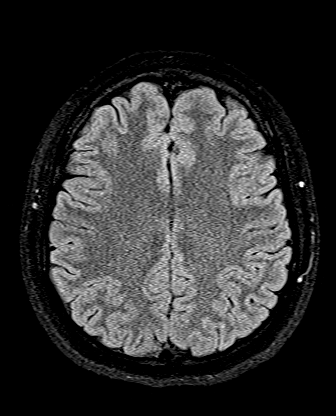
[im 55/55]
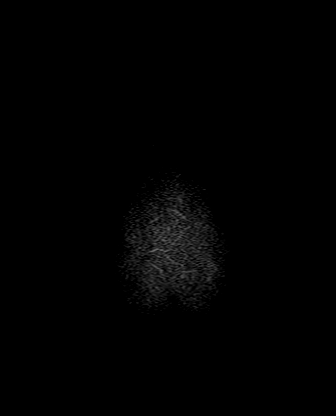

[Series 14: T1 · coronal · non-contrast · 3.0mm · 0.21mm/px · 1 of 13 slices shown (2 of 3)]
[im 1/13]
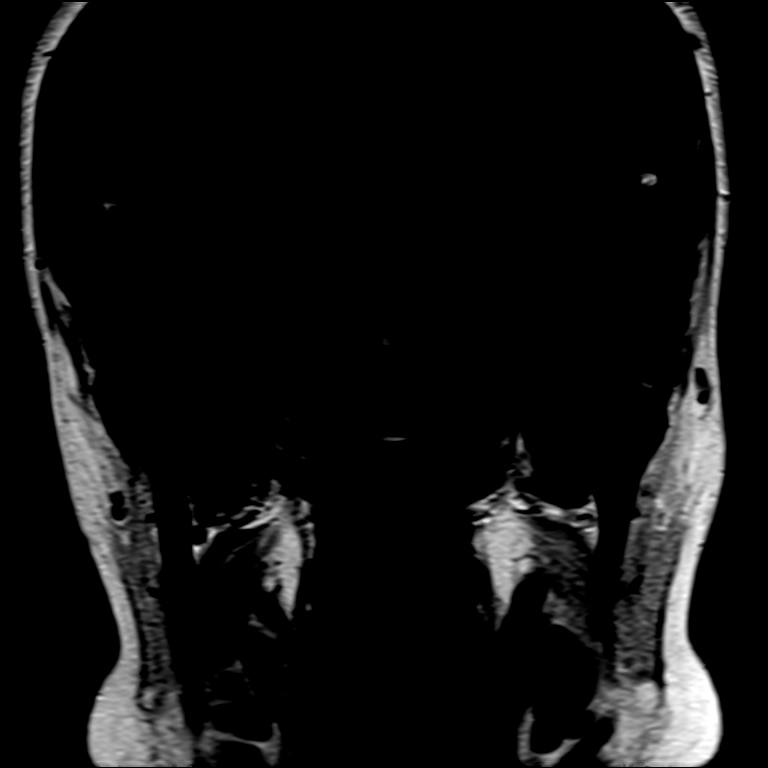

[Series 16: T1 · axial · non-contrast · 3.0mm · 0.21mm/px · 1 of 15 slices shown (3 of 3)]
[im 1/15]
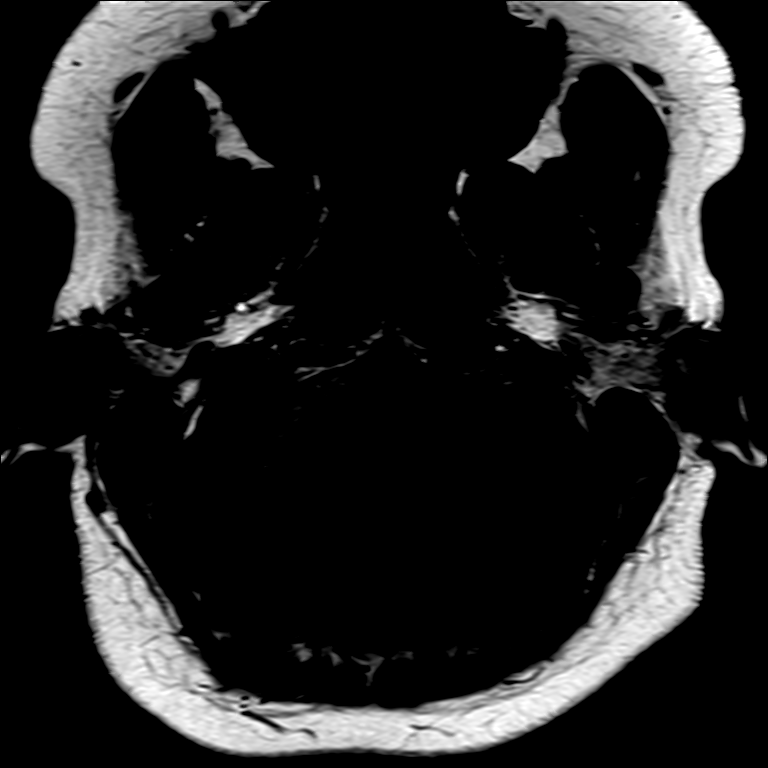

[Series 17: T1 post-contrast · axial · 3.0mm · 0.21mm/px · 1 of 15 slices shown (1 of 3)]
[im 1/15]
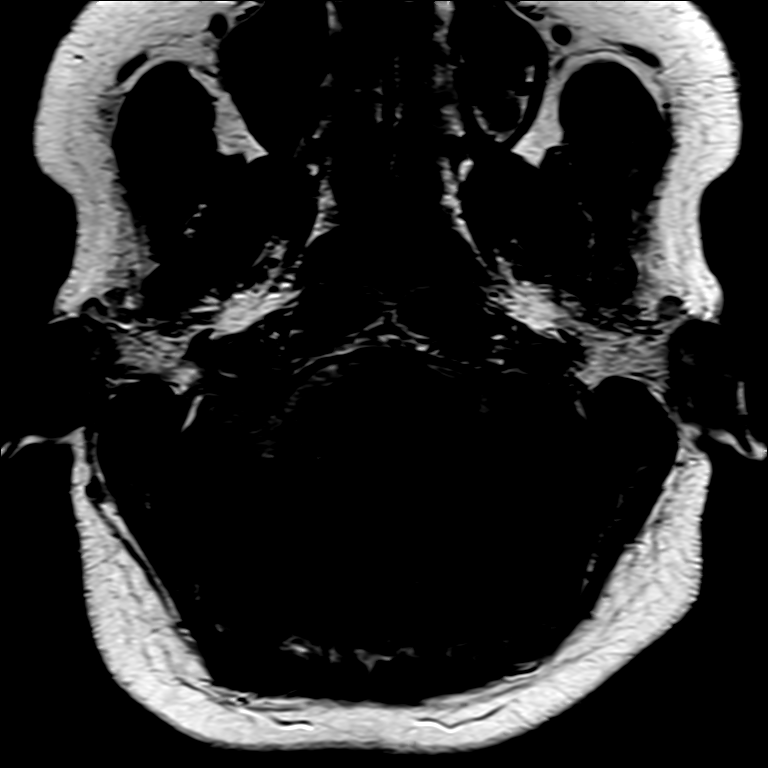

[Series 18: T1 post-contrast · coronal · 3.0mm · 0.21mm/px · 1 of 13 slices shown (2 of 3)]
[im 1/13]
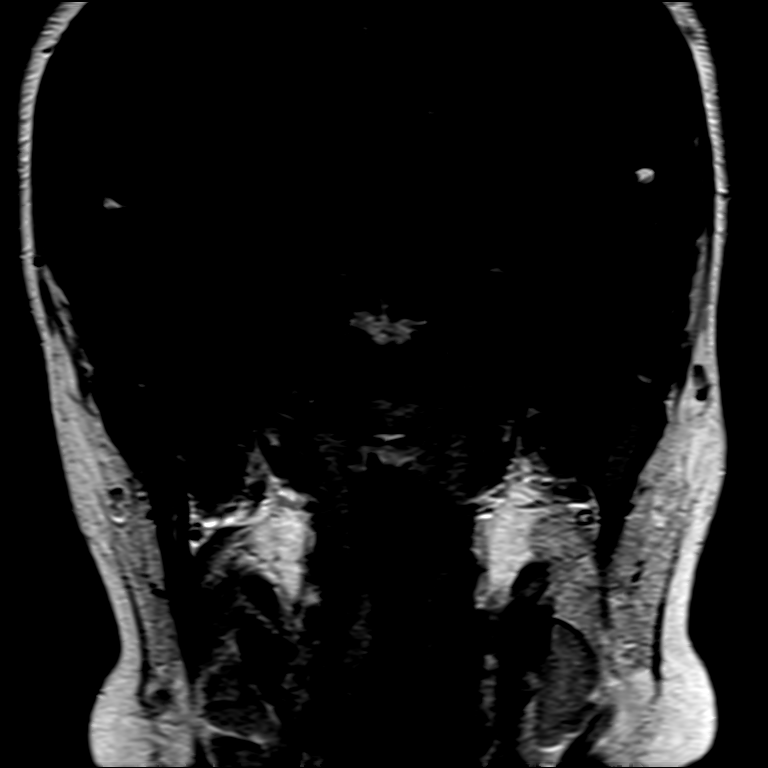

[Series 19: T1 post-contrast · axial · 1.0mm · 0.98mm/px · z∈[-150,+17]mm · 9 of 176 slices shown (3 of 3)]
[im 1/176]
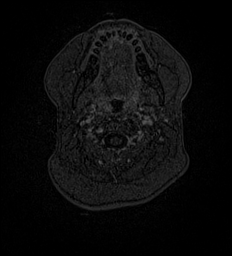
[im 30/176]
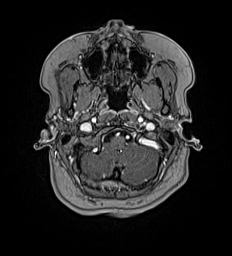
[im 59/176]
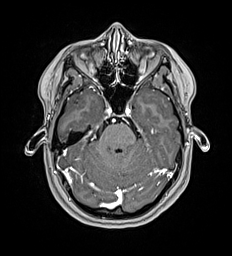
[im 73/176]
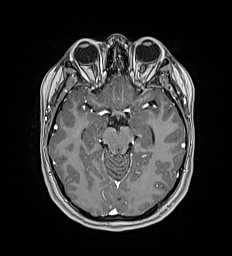
[im 88/176]
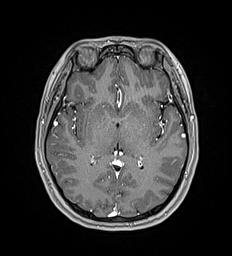
[im 103/176]
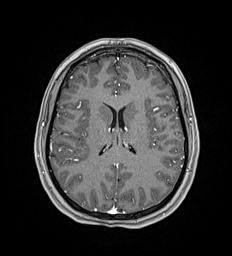
[im 117/176]
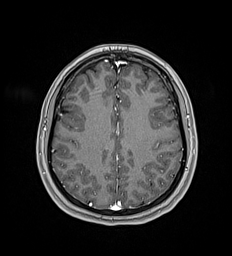
[im 146/176]
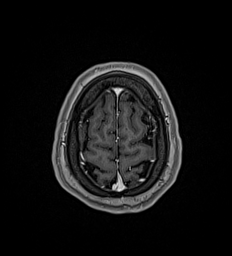
[im 176/176]
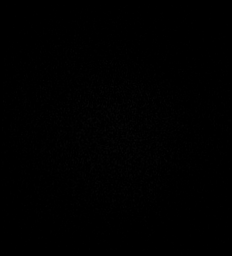

[28 of 48 positions shown; findings below may reference images not displayed]

FINDINGS: Brain:

Cerebral volume is normal.

No focal parenchymal signal abnormality is identified.

No evidence of an intracranial mass. Specifically, no
cerebellopontine angle or internal auditory canal mass is
demonstrated. Unremarkable appearance of the seventh and eighth
cranial nerves bilaterally.

There is no acute infarct.

No chronic intracranial blood products.

No extra-axial fluid collection.

No midline shift.

No abnormal intracranial enhancement.

Vascular: Expected proximal arterial flow voids. Incidentally noted
developmental venous anomaly within the posterior right frontal lobe
(an anatomic variant).

Skull and upper cervical spine: No focal marrow lesion.

Sinuses/Orbits: Visualized orbits show no acute finding. No
significant paranasal sinus disease.
IMPRESSION: No evidence of acute intracranial abnormality.

No cerebellopontine angle or internal auditory canal mass. No cause
of the reported symptoms is identified.

Incidentally noted developmental venous anomaly within the right
frontal lobe (an anatomic variant).

Otherwise unremarkable MRI appearance of the brain.

## 2020-06-28 MED ORDER — GADOBUTROL 1 MMOL/ML IV SOLN
10.0000 mL | Freq: Once | INTRAVENOUS | Status: AC | PRN
  Administered 2020-06-28: 10 mL via INTRAVENOUS

## 2020-07-03 ENCOUNTER — Other Ambulatory Visit: Payer: Self-pay | Admitting: Otolaryngology

## 2020-07-03 DIAGNOSIS — H93A2 Pulsatile tinnitus, left ear: Secondary | ICD-10-CM

## 2020-07-03 DIAGNOSIS — H903 Sensorineural hearing loss, bilateral: Secondary | ICD-10-CM

## 2020-07-09 ENCOUNTER — Ambulatory Visit
Admission: RE | Admit: 2020-07-09 | Discharge: 2020-07-09 | Disposition: A | Discharge status: Home / Self Care | Payer: Medicaid Other | Source: Ambulatory Visit | Attending: Otolaryngology | Admitting: Otolaryngology

## 2020-07-09 ENCOUNTER — Other Ambulatory Visit: Payer: Self-pay

## 2020-07-09 DIAGNOSIS — H903 Sensorineural hearing loss, bilateral: Secondary | ICD-10-CM | POA: Insufficient documentation

## 2020-07-09 DIAGNOSIS — H93A2 Pulsatile tinnitus, left ear: Secondary | ICD-10-CM | POA: Diagnosis present

## 2020-07-09 IMAGING — MR MR MRA HEAD W/O CM
1 series · 19 of 48 positions shown · non-contrast
Comparison: Brain MRI [DATE].

CLINICAL DATA: Pulsatile tinnitus of left ear. Asymmetrical
sensorineural hearing loss. Additional history provided by scanning
technologist: Patient reports 4 months of left ear "whooshing"
sound.

EXAM:
MRA HEAD WITHOUT CONTRAST
TECHNIQUE: Angiographic images of the Circle of Willis were acquired using MRA
technique without intravenous contrast.

[Series 5: TOF · axial · 0.5mm · 0.41mm/px · z∈[-94,+2]mm · 19 of 205 slices shown]
[im 1/205]
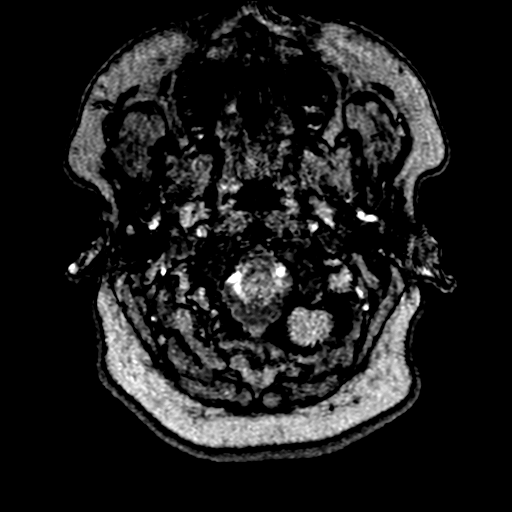
[im 5/205]
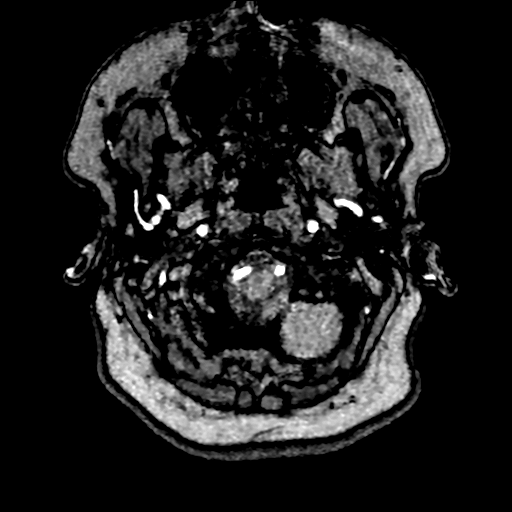
[im 9/205]
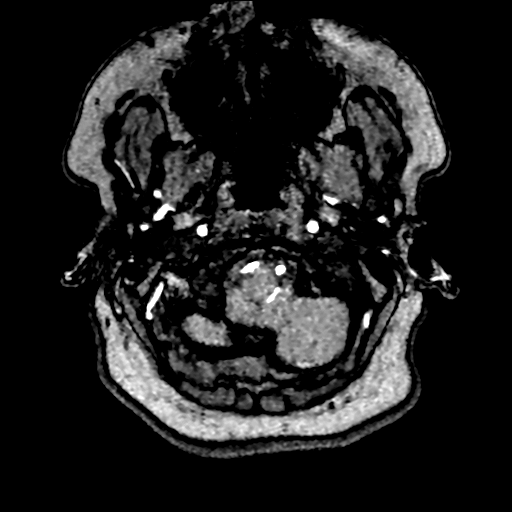
[im 14/205]
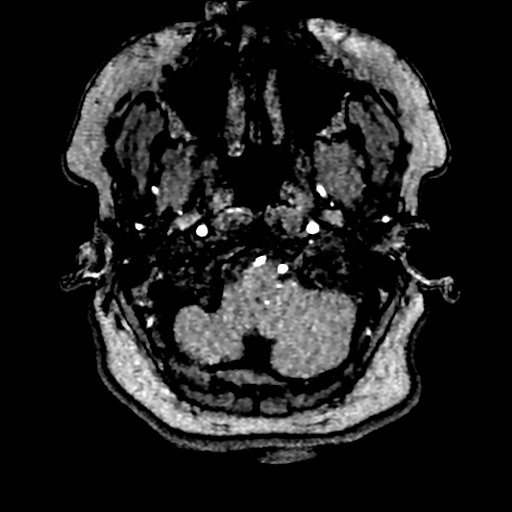
[im 18/205]
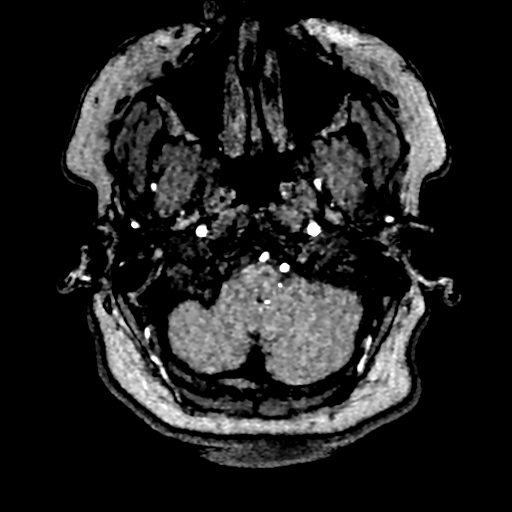
[im 22/205]
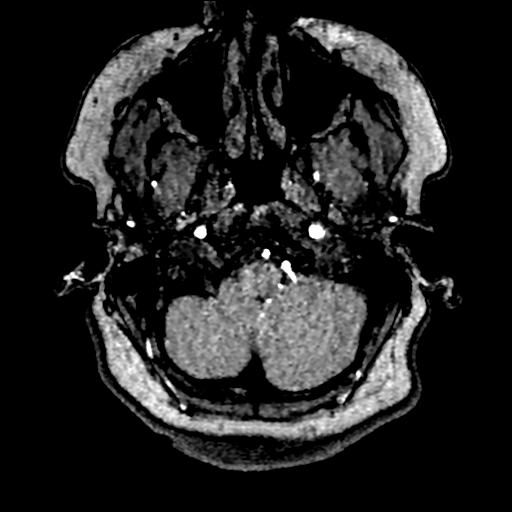
[im 27/205]
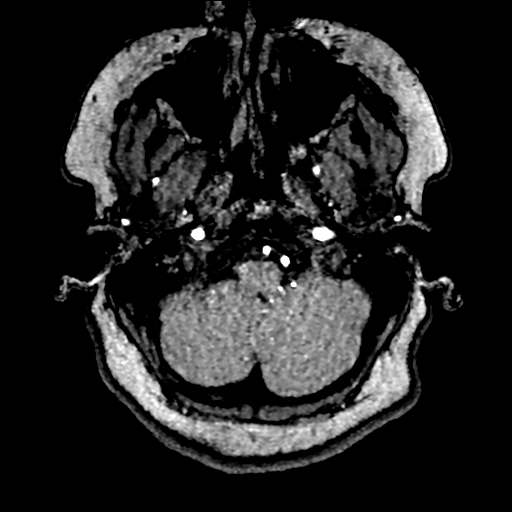
[im 31/205]
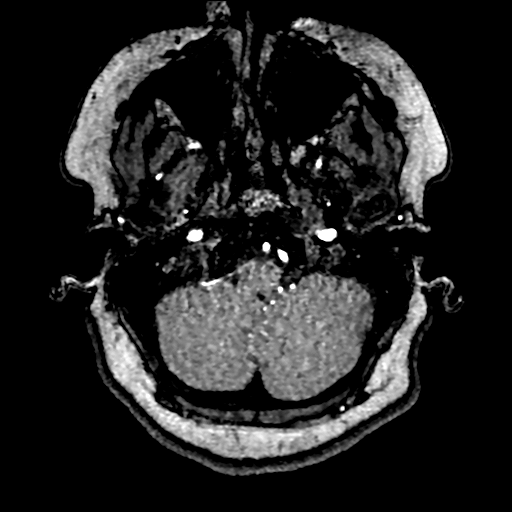
[im 35/205]
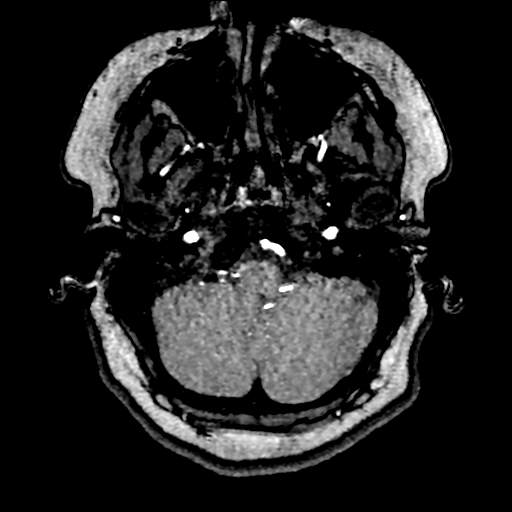
[im 40/205]
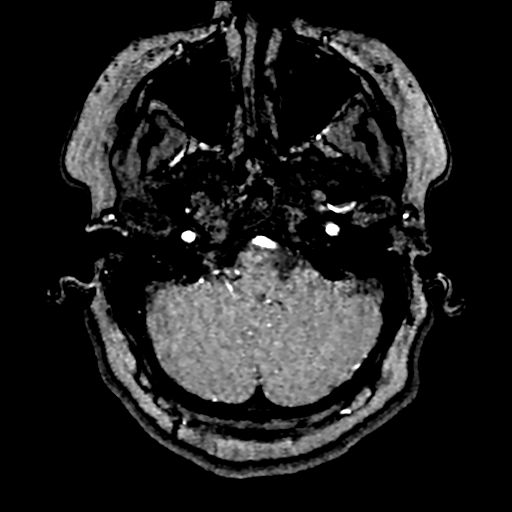
[im 44/205]
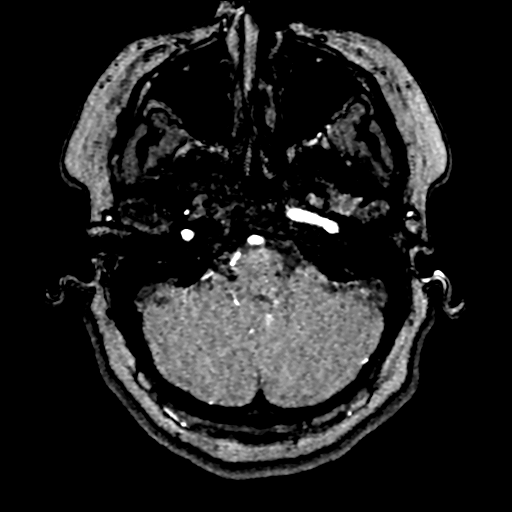
[im 66/205]
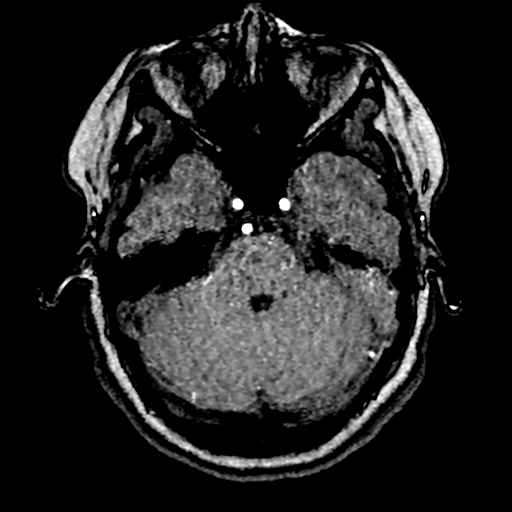
[im 92/205]
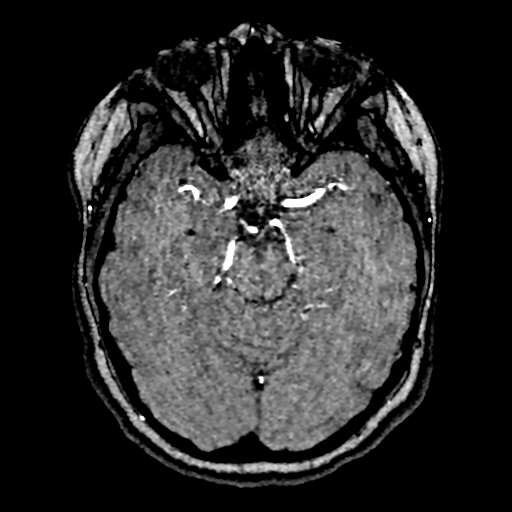
[im 105/205]
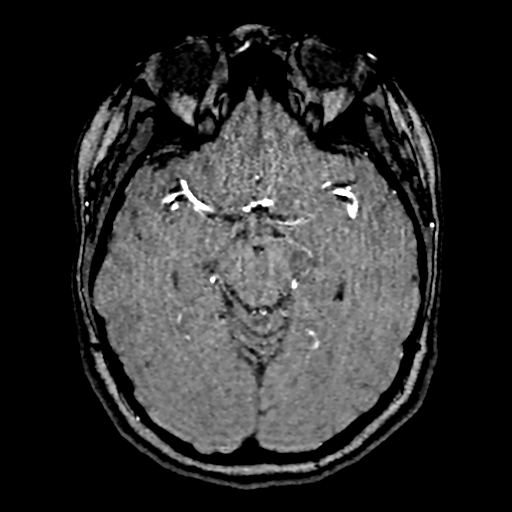
[im 118/205]
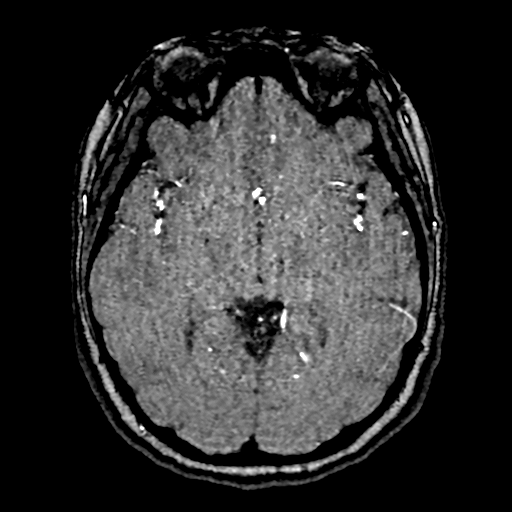
[im 144/205]
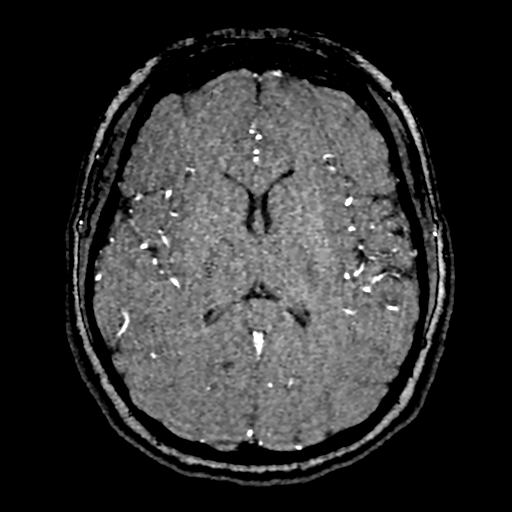
[im 170/205]
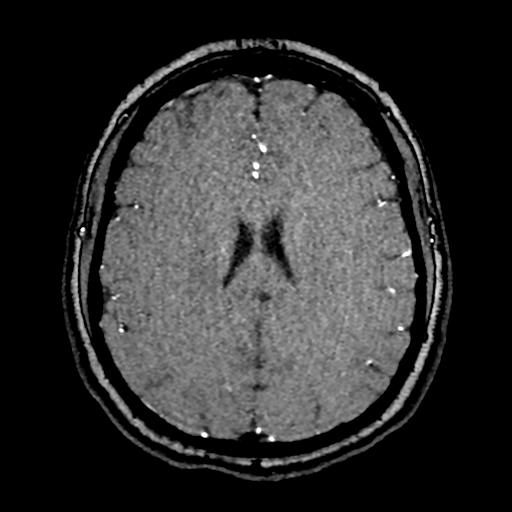
[im 174/205]
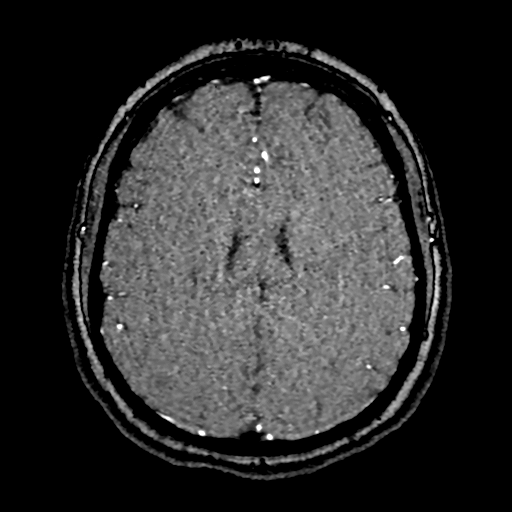
[im 196/205]
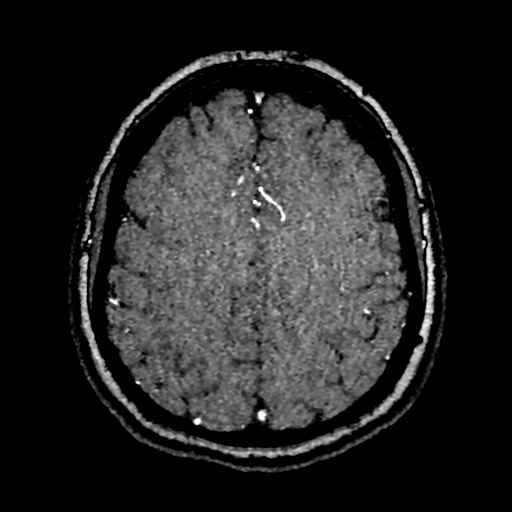

[19 of 48 positions shown; findings below may reference images not displayed]

FINDINGS: Anterior circulation:

The intracranial internal carotid arteries are patent. The M1 middle
cerebral arteries are patent. No M2 proximal branch occlusion or
high-grade proximal stenosis is identified. The anterior cerebral
arteries are patent. No intracranial aneurysm is identified.

Posterior circulation:

The intracranial vertebral arteries are patent. The basilar artery
is patent. The posterior cerebral arteries are patent. No
intracranial aneurysm is identified. Probable small bilateral
posterior communicating arteries.

Anatomic variants: Incidentally noted fenestration within the A1
right anterior cerebral artery.
IMPRESSION: Incidentally noted fenestration within the A1 right anterior
cerebral artery. Otherwise unremarkable examination.

No intracranial large vessel occlusion or proximal high-grade
arterial stenosis.

No intracranial aneurysm is identified.

## 2020-10-11 ENCOUNTER — Other Ambulatory Visit: Payer: Self-pay | Admitting: Neurology

## 2020-10-11 DIAGNOSIS — H93A2 Pulsatile tinnitus, left ear: Secondary | ICD-10-CM

## 2020-10-15 ENCOUNTER — Other Ambulatory Visit: Payer: Self-pay | Admitting: Neurology

## 2020-10-15 DIAGNOSIS — H93A2 Pulsatile tinnitus, left ear: Secondary | ICD-10-CM

## 2020-10-15 DIAGNOSIS — G939 Disorder of brain, unspecified: Secondary | ICD-10-CM

## 2020-10-15 DIAGNOSIS — R519 Headache, unspecified: Secondary | ICD-10-CM

## 2020-10-18 ENCOUNTER — Other Ambulatory Visit: Payer: Self-pay

## 2020-10-18 ENCOUNTER — Ambulatory Visit
Admission: RE | Admit: 2020-10-18 | Discharge: 2020-10-18 | Disposition: A | Discharge status: Home / Self Care | Payer: Medicaid Other | Source: Ambulatory Visit | Attending: Neurology | Admitting: Neurology

## 2020-10-18 DIAGNOSIS — H93A2 Pulsatile tinnitus, left ear: Secondary | ICD-10-CM | POA: Insufficient documentation

## 2020-10-18 IMAGING — MR MR MRV HEAD WO/W CM
4 of 6 series · 10 of 48 positions shown · IV contrast (10ml Gadavist)
Comparison: Previous MRIs from [DATE] and [DATE].

CLINICAL DATA: Initial evaluation for loosening sound, mostly in
left ear, recurrent pressure headache.

EXAM:
MR VENOGRAM HEAD WITHOUT AND WITH CONTRAST
TECHNIQUE: Angiographic images of the intracranial venous structures were
acquired using MRV technique without and with intravenous contrast.
CONTRAST:  10mL GADAVIST GADOBUTROL 1 MMOL/ML IV SOLN

[Series 5: TOF · coronal · 2.5mm · 0.98mm/px · 3 of 125 slices shown]
[im 16/125]
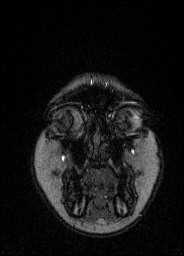
[im 63/125]
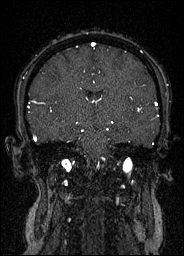
[im 109/125]
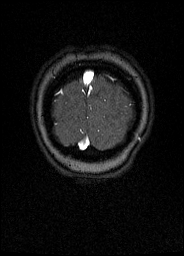

[Series 12: T1 · sagittal · 1.0mm · 0.98mm/px · 3 of 190 slices shown]
[im 19/190]
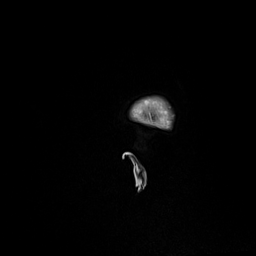
[im 95/190]
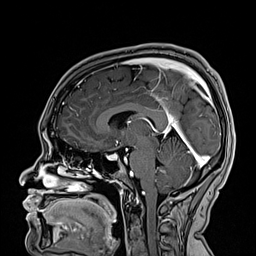
[im 171/190]
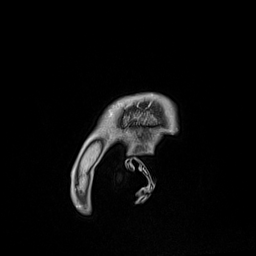

[Series 1005: mpr 1mm range · coronal · 1.0mm · 0.24mm/px · 3 of 190 slices shown]
[im 19/190]
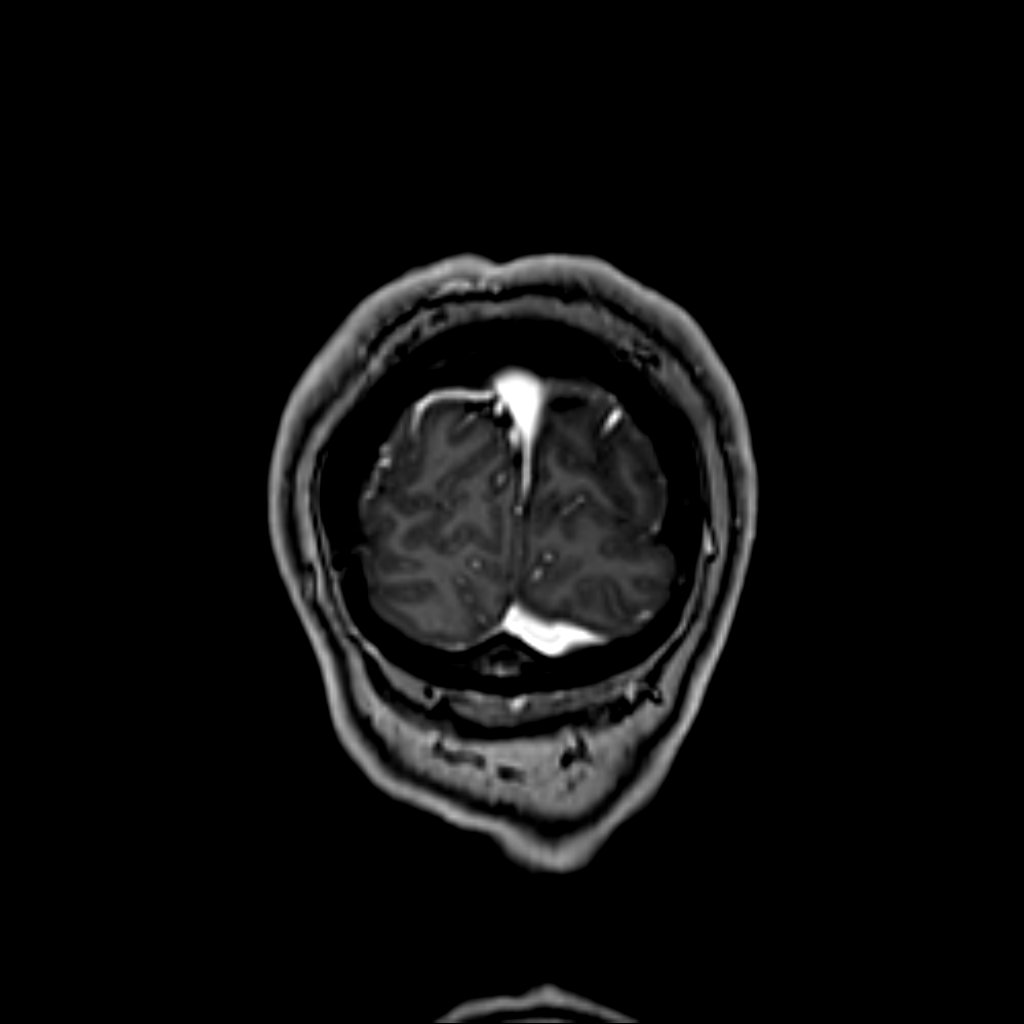
[im 95/190]
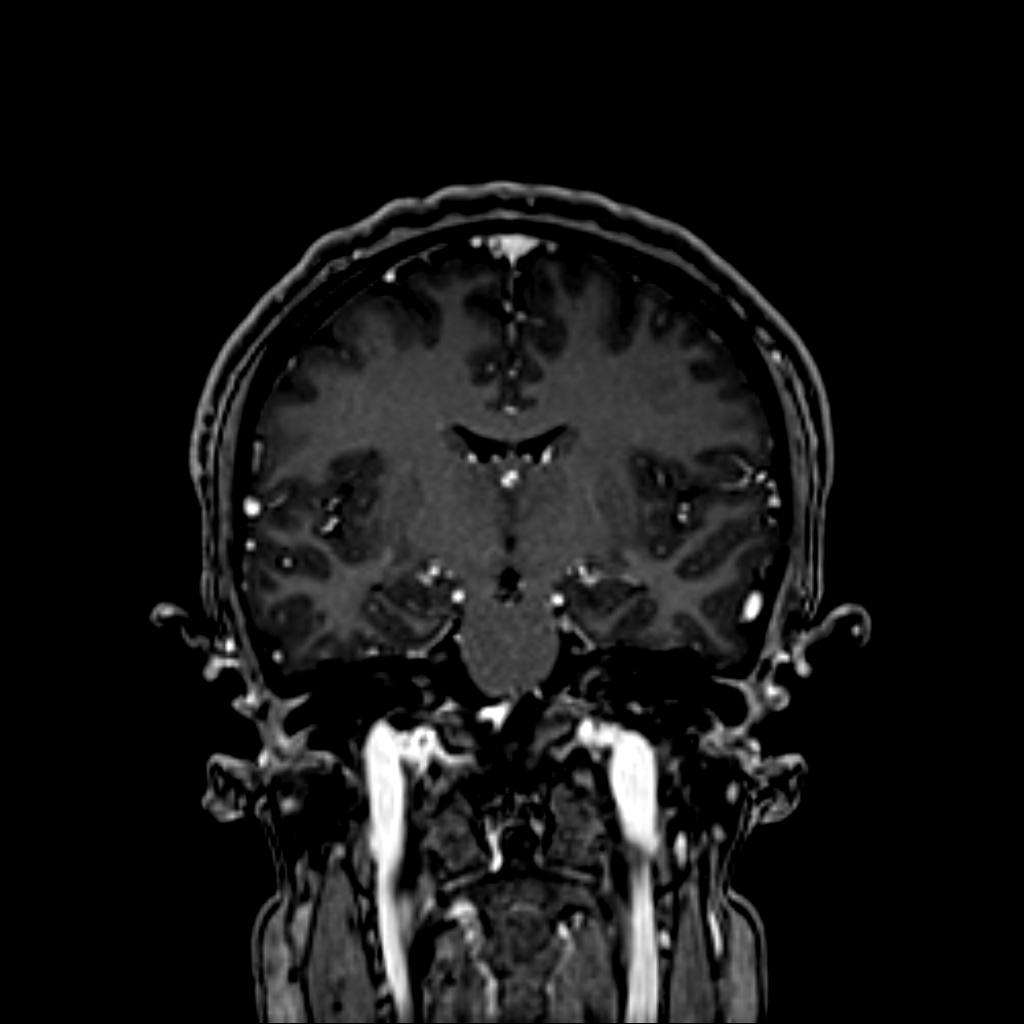
[im 171/190]
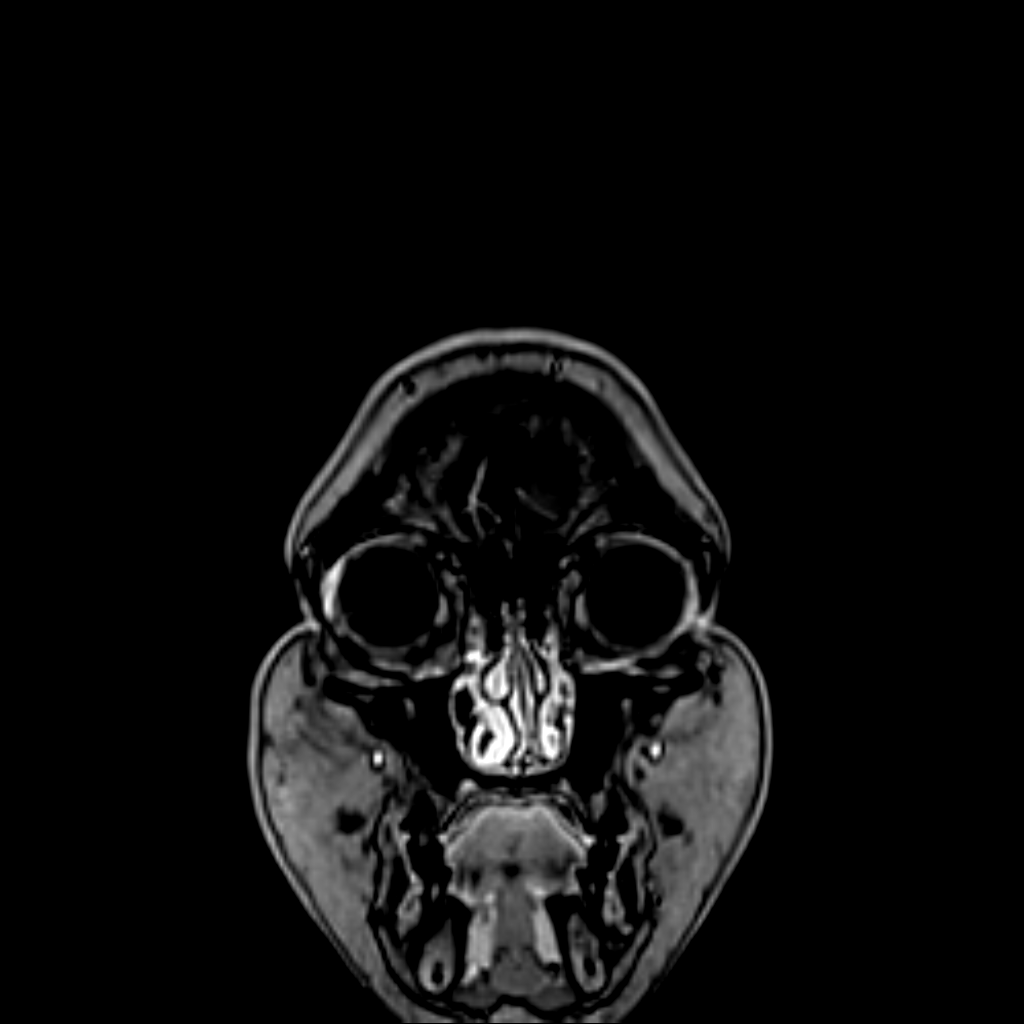

[Series 1015: mpr 1mm range(2) · axial · 1.0mm · 0.24mm/px · 1 of 190 slices shown]
[im 19/190]
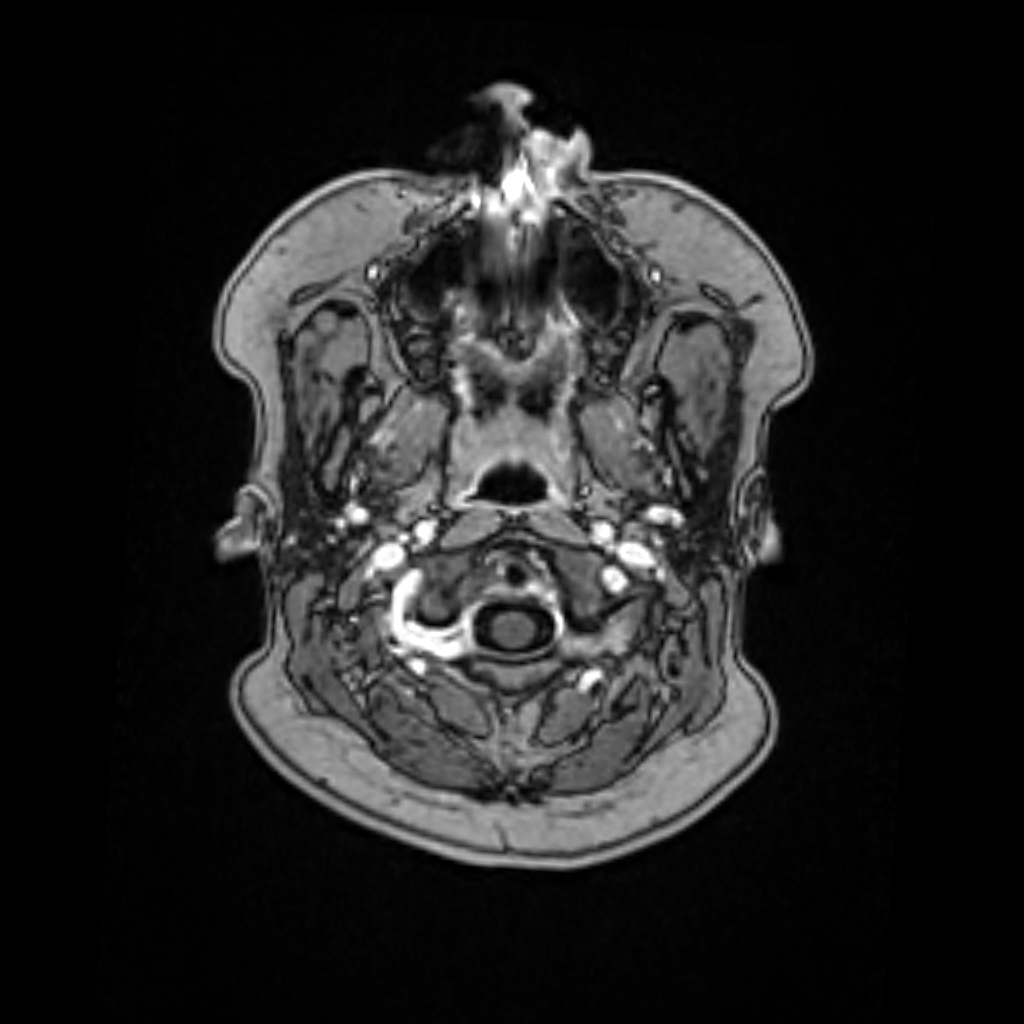

[10 of 48 positions shown; findings below may reference images not displayed]

FINDINGS: Normal flow related signal and enhancement seen throughout the
superior sagittal sinus to the torcula. Transverse and sigmoid
sinuses are patent as are the visualized proximal internal jugular
veins. Right transverse sinus dominant. Straight sinus, vein of
AACHIK, internal cerebral veins, and basal veins of AACHIK are
patent. No abnormality seen about the cavernous sinus. No
appreciable cortical vein thrombosis. No evidence for dural sinus
thrombosis.

Small DVA again incidentally noted.
IMPRESSION: Normal intracranial MRV. No evidence for dural sinus thrombosis.

## 2020-10-18 MED ORDER — GADOBUTROL 1 MMOL/ML IV SOLN
10.0000 mL | Freq: Once | INTRAVENOUS | Status: AC | PRN
  Administered 2020-10-18: 10 mL via INTRAVENOUS

## 2020-10-23 ENCOUNTER — Other Ambulatory Visit: Admission: RE | Admit: 2020-10-23 | Payer: Medicaid Other | Source: Ambulatory Visit

## 2020-10-25 ENCOUNTER — Ambulatory Visit: Payer: Medicaid Other

## 2020-10-25 ENCOUNTER — Ambulatory Visit
Admission: RE | Admit: 2020-10-25 | Discharge: 2020-10-25 | Disposition: A | Discharge status: Home / Self Care | Payer: Medicaid Other | Source: Ambulatory Visit | Attending: Neurology | Admitting: Neurology

## 2020-10-25 ENCOUNTER — Other Ambulatory Visit: Payer: Self-pay

## 2020-10-25 DIAGNOSIS — H93A2 Pulsatile tinnitus, left ear: Secondary | ICD-10-CM

## 2020-10-25 DIAGNOSIS — G939 Disorder of brain, unspecified: Secondary | ICD-10-CM | POA: Insufficient documentation

## 2020-10-25 DIAGNOSIS — R519 Headache, unspecified: Secondary | ICD-10-CM

## 2020-10-25 LAB — PROTEIN, CSF: Total  Protein, CSF: 24 mg/dL (ref 15–45)

## 2020-10-25 LAB — CSF CELL COUNT WITH DIFFERENTIAL
Eosinophils, CSF: 0 %
Lymphs, CSF: 100 %
Monocyte-Macrophage-Spinal Fluid: 0 %
RBC Count, CSF: 3 /mm3 (ref 0–3)
Segmented Neutrophils-CSF: 0 %
Tube #: 3
WBC, CSF: 3 /mm3 (ref 0–5)

## 2020-10-25 LAB — GLUCOSE, CSF: Glucose, CSF: 52 mg/dL (ref 40–70)

## 2020-10-25 IMAGING — RF DG FLUORO GUIDE SPINAL/SI JT INJ*L*
3 series · 3 of 3 positions shown · non-contrast
Comparison: [DATE] MR imaging of the head report reviewed prior
to the procedure.

CLINICAL DATA: Patient with complaints of left ear tinnitus and
headache request received for diagnostic lumbar puncture.

EXAM:
DIAGNOSTIC LUMBAR PUNCTURE UNDER FLUOROSCOPIC GUIDANCE

[Series 1: cp_standard · 0.20mm/px · 1 of 1 slices shown (1 of 2)]
[im 1/1]
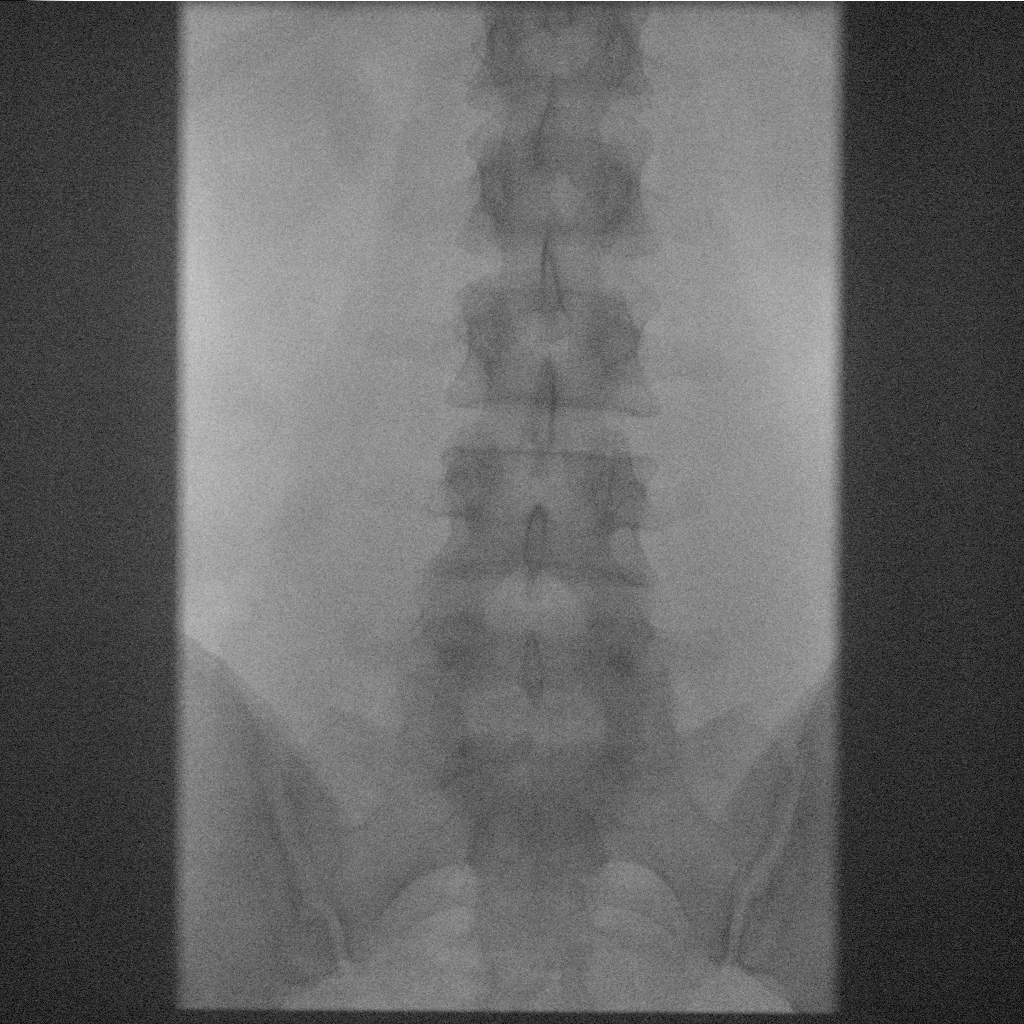

[Series 2: fluoro_iodine 2fps_bw · 0.20mm/px · 1 of 1 slices shown]
[im 1/1]
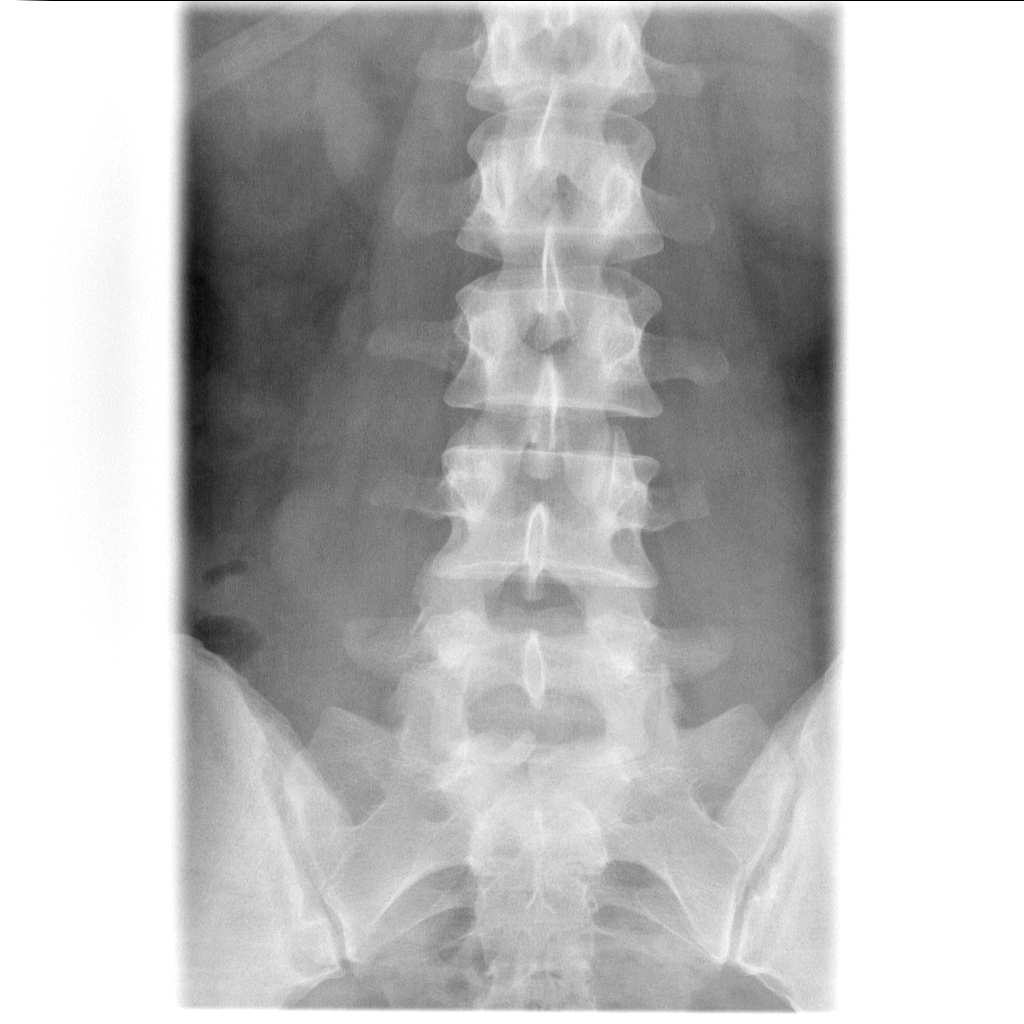

[Series 3: cp_standard · 0.19mm/px · 1 of 1 slices shown (2 of 2)]
[im 1/1]
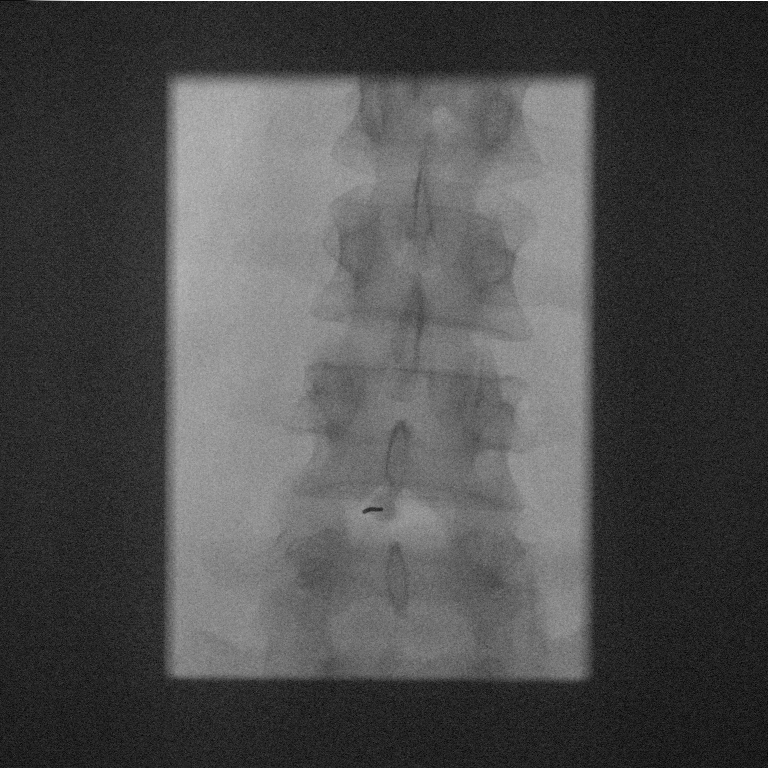

[3 of 3 positions shown; findings below may reference images not displayed]

FLUOROSCOPY TIME:  Fluoroscopy Time: 0.2 minute

Radiation Exposure Index (if provided by the fluoroscopic device):
4.30 mGy

Number of Acquired Spot Images: 3

PROCEDURE:
Informed consent was obtained from the patient prior to the
procedure, including potential complications of infection, bleeding,
CSF leak requiring additional procedures, paresthesias, headache,
allergy, and pain. With the patient prone, the lower back was
prepped with Betadine. 1% Lidocaine was used for local anesthesia.
Lumbar puncture was performed at the L4-L5 level using a 20 gauge
needle with return of colorless CSF with an opening pressure of 20
cm water. Eleven ml of CSF were obtained for laboratory studies.
Closing pressure of 16 cm of water. The patient tolerated the
procedure well and there were no apparent complications. A sterile
bandage was applied.
IMPRESSION: Technically successful fluoroscopic guided lumbar puncture.

Procedure performed by MEE LEE, supervised by MEE LEE
MEE LEE.

## 2020-10-25 MED ORDER — LIDOCAINE HCL (PF) 1 % IJ SOLN
10.0000 mL | Freq: Once | INTRAMUSCULAR | Status: AC
  Administered 2020-10-25: 5 mL
  Filled 2020-10-25: qty 10

## 2020-10-25 NOTE — Procedures (Signed)
PROCEDURE SUMMARY:  Successful fluoroscopic guided lumbar puncture.  Opening pressure 20 cm of H20 Closing pressure 16 cm of H2O Yielded 11 mL of colorless CSF fluid.  No immediate complications.  Pt tolerated well.   Specimen was sent for labs.  EBL N/A  Mckenzie Obrien 10/25/2020 9:18 AM

## 2020-10-26 LAB — IGG CSF INDEX
Albumin CSF-mCnc: 14 mg/dL (ref 7–29)
Albumin: 4.7 g/dL (ref 3.9–5.0)
CSF IgG Index: 0.6 (ref 0.0–0.7)
IgG (Immunoglobin G), Serum: 1032 mg/dL (ref 586–1602)
IgG, CSF: 1.8 mg/dL (ref 0.0–6.7)
IgG/Alb Ratio, CSF: 0.13 (ref 0.00–0.25)

## 2020-10-29 LAB — OLIGOCLONAL BANDS, CSF + SERM

## 2021-09-11 ENCOUNTER — Ambulatory Visit
Admission: EM | Admit: 2021-09-11 | Discharge: 2021-09-11 | Disposition: A | Discharge status: Home / Self Care | Payer: Medicaid Other | Attending: Emergency Medicine | Admitting: Emergency Medicine

## 2021-09-11 DIAGNOSIS — J020 Streptococcal pharyngitis: Secondary | ICD-10-CM | POA: Diagnosis present

## 2021-09-11 DIAGNOSIS — U071 COVID-19: Secondary | ICD-10-CM | POA: Insufficient documentation

## 2021-09-11 LAB — RESP PANEL BY RT-PCR (FLU A&B, COVID) ARPGX2
Influenza A by PCR: NEGATIVE
Influenza B by PCR: NEGATIVE
SARS Coronavirus 2 by RT PCR: POSITIVE — AB

## 2021-09-11 LAB — GROUP A STREP BY PCR: Group A Strep by PCR: DETECTED — AB

## 2021-09-11 MED ORDER — AMOXICILLIN-POT CLAVULANATE 875-125 MG PO TABS: 1.0000 | ORAL_TABLET | Freq: Two times a day (BID) | ORAL | 0 refills | Status: AC

## 2021-09-11 MED ORDER — PROMETHAZINE-DM 6.25-15 MG/5ML PO SYRP: 5.0000 mL | ORAL_SOLUTION | Freq: Four times a day (QID) | ORAL | 0 refills | Status: DC | PRN

## 2021-09-11 MED ORDER — IPRATROPIUM BROMIDE 0.06 % NA SOLN: 2.0000 | Freq: Four times a day (QID) | NASAL | 12 refills | Status: AC

## 2021-09-11 MED ORDER — BENZONATATE 100 MG PO CAPS: 200.0000 mg | ORAL_CAPSULE | Freq: Three times a day (TID) | ORAL | 0 refills | Status: DC

## 2021-09-11 MED ORDER — MOLNUPIRAVIR EUA 200MG CAPSULE: 4.0000 | ORAL_CAPSULE | Freq: Two times a day (BID) | ORAL | 0 refills | Status: AC

## 2021-09-11 NOTE — ED Triage Notes (Signed)
Pt states cough, body aches, sore throat, diarrhea, runny nose and dizziness x day 3.

## 2021-09-11 NOTE — Discharge Instructions (Signed)
You have tested positive for both strep and COVID today.  Take the Augmentin twice daily for 7 days for treatment of your strep throat.  Gargle with warm salt water 2-3 times a day to soothe your throat, aid in pain relief, and aid in healing.  Take over-the-counter ibuprofen according to the package instructions as needed for pain.  You can also use Chloraseptic or Sucrets lozenges, 1 lozenge every 2 hours as needed for throat pain.  You will need to quarantine for 5 days from onset of your symptoms due to being COVID-positive.  After day 5 you can break quarantine if your symptoms have improved and you have not had a fever for 24 hours let Tylenol and ibuprofen.  Use Tylenol and ibuprofen according to the package instructions as needed for fever and pain.  Take molnupiravir twice daily for 5 days for treatment of COVID-19.  Use the Atrovent nasal spray, 2 squirts in each nostril every 6 hours, as needed for runny nose and postnasal drip.  Use the Tessalon Perles every 8 hours during the day.  Take them with a small sip of water.  They may give you some numbness to the base of your tongue or a metallic taste in your mouth, this is normal.  Use the Promethazine DM cough syrup at bedtime for cough and congestion.  It will make you drowsy so do not take it during the day.  If you develop shortness of breath, especially at rest, feel you cannot catch her breath, you cannot speak in full sentences, or late sign is your lips start turning blue you need to call 911 and go to the ER for evaluation.

## 2021-09-11 NOTE — ED Provider Notes (Signed)
[facility]    CSN: [id number] Arrival date & time: 09/11/21  1624      History   Chief Complaint Chief Complaint  Patient presents with   Cough    Flu exposure - Entered by patient    HPI Tanina Jaydyn Menon is a 26 y.o. female.   HPI  26 year old female here for evaluation of respiratory complaints.  Patient reports that for last 3 days she has been experiencing runny nose with clear nasal discharge, sore throat, body aches, nonproductive cough, mild shortness of breath, and diarrhea.  She denies any measured fever, ear pain, wheezing, nausea, or vomiting.  2 of her coworkers recently tested positive for influenza and 1 tested positive for COVID.  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 01/24/2019   Insomnia 01/24/2019   Postpartum anxiety 01/24/2019   Chronic gastric ulcer without hemorrhage and without perforation 11/21/2018   IUD (intrauterine device) in place 09/17/2018   Postpartum depression (CODE) 07/13/2018   History of substance abuse (HCC) 01/21/2018   Mild tetrahydrocannabinol (THC) abuse 10/25/2017   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   Anemia 08/06/2015   History of pneumonia 07/30/2015   Obesity (BMI 30.0-34.9) 07/30/2015   Acquired deformity of hip, left 05/15/2015   History of depression 07/19/2013   History of marijuana use 07/19/2013   History of migraine 07/19/2013    Past Surgical History:  Procedure Laterality Date   hip reconstruction Bilateral    R) at 26 yrs old and L) at 72   HIP SURGERY Right     OB History     Gravida  1   Para      Term      Preterm      AB      Living         SAB      IAB      Ectopic      Multiple      Live Births               Home Medications    Prior to Admission medications   Medication Sig  Start Date End Date Taking? Authorizing Provider  amoxicillin-clavulanate (AUGMENTIN) 875-125 MG tablet Take 1 tablet by mouth every 12 (twelve) hours for 10 days. 09/11/21 09/21/21 Yes Becky Augusta, NP  benzonatate (TESSALON) 100 MG capsule Take 2 capsules (200 mg total) by mouth every 8 (eight) hours. 09/11/21  Yes Becky Augusta, NP  ipratropium (ATROVENT) 0.06 % nasal spray Place 2 sprays into both nostrils 4 (four) times daily. 09/11/21  Yes Becky Augusta, NP  molnupiravir EUA (LAGEVRIO) 200 mg CAPS capsule Take 4 capsules (800 mg total) by mouth 2 (two) times daily for 5 days. 09/11/21 09/16/21 Yes Becky Augusta, NP  promethazine-dextromethorphan (PROMETHAZINE-DM) 6.25-15 MG/5ML syrup Take 5 mLs by mouth 4 (four) times daily as needed. 09/11/21  Yes Becky Augusta, NP  ibuprofen (ADVIL) 400 MG tablet Take 400 mg by mouth every 6 (six) hours as needed. Wisdom tooth removal    [provider]    Family History Family History  Problem Relation Age of Onset   Healthy Mother    Heart attack Father 53   Diabetes Maternal Grandmother    Heart disease Maternal Grandmother    Stroke Maternal  Grandfather    Heart disease Brother     Social History Social History   Tobacco Use   Smoking status: Some Days    Types: Cigarettes, E-cigarettes    Last attempt to quit: 07/15/2017    Years since quitting: 4.1   Smokeless tobacco: Never  Vaping Use   Vaping Use: Some days  Substance Use Topics   Alcohol use: Not Currently    Comment: occ   Drug use: Not Currently    Types: Marijuana, Cocaine     Allergies   Abilify [aripiprazole]   Review of Systems Review of Systems  Constitutional:  Negative for fever.  HENT:  Positive for congestion, ear pain, rhinorrhea and sore throat.   Respiratory:  Positive for cough and shortness of breath. Negative for wheezing.   Gastrointestinal:  Positive for diarrhea. Negative for nausea and vomiting.  Musculoskeletal:  Positive for arthralgias and myalgias.   Skin:  Negative for rash.  Hematological: Negative.   Psychiatric/Behavioral: Negative.       Physical Exam Triage Vital Signs ED Triage Vitals  Enc Vitals Group     BP 09/11/21 1655 (!) 122/92     Pulse Rate 09/11/21 1655 82     Resp 09/11/21 1655 18     Temp 09/11/21 1655 98.9 F (37.2 C)     Temp Source 09/11/21 1655 Oral     SpO2 09/11/21 1655 99 %     Weight 09/11/21 1700 179 lb (81.2 kg)     Height 09/11/21 1700 5\' 6"  (1.676 m)     Head Circumference --      Peak Flow --      Pain Score 09/11/21 1659 3     Pain Loc --      Pain Edu? --      Excl. in GC? --    No data found.  Updated Vital Signs BP (!) 122/92 (BP Location: Left Arm)   Pulse 82   Temp 98.9 F (37.2 C) (Oral)   Resp 18   Ht 5\' 6"  (1.676 m)   Wt 179 lb (81.2 kg)   LMP 06/06/2021 (Approximate)   SpO2 99%   BMI 28.89 kg/m   Visual Acuity Right Eye Distance:   Left Eye Distance:   Bilateral Distance:    Right Eye Near:   Left Eye Near:    Bilateral Near:     Physical Exam Vitals and nursing note reviewed.  Constitutional:      Appearance: Normal appearance. She is not ill-appearing.  HENT:     Head: Normocephalic and atraumatic.     Right Ear: Tympanic membrane, ear canal and external ear normal. There is no impacted cerumen.     Left Ear: Tympanic membrane, ear canal and external ear normal. There is no impacted cerumen.     Nose: Congestion and rhinorrhea present.     Mouth/Throat:     Mouth: Mucous membranes are moist.     Pharynx: Oropharynx is clear. Posterior oropharyngeal erythema present. No oropharyngeal exudate.  Cardiovascular:     Rate and Rhythm: Normal rate and regular rhythm.     Pulses: Normal pulses.     Heart sounds: Normal heart sounds. No murmur heard.    No friction rub. No gallop.  Pulmonary:     Effort: Pulmonary effort is normal.     Breath sounds: Normal breath sounds. No wheezing, rhonchi or rales.  Musculoskeletal:     Cervical back: Normal range of  motion and neck supple.  Lymphadenopathy:     Cervical: No cervical adenopathy.  Skin:    General: Skin is warm and dry.     Capillary Refill: Capillary refill takes less than 2 seconds.     Findings: No erythema or rash.  Neurological:     General: No focal deficit present.     Mental Status: She is alert and oriented to person, place, and time.  Psychiatric:        Mood and Affect: Mood normal.        Behavior: Behavior normal.        Thought Content: Thought content normal.        Judgment: Judgment normal.      UC Treatments / Results  Labs (all labs ordered are listed, but only abnormal results are displayed) Labs Reviewed  RESP PANEL BY RT-PCR (FLU A&B, COVID) ARPGX2 - Abnormal; Notable for the following components:      Result Value   SARS Coronavirus 2 by RT PCR POSITIVE (*)    All other components within normal limits  GROUP A STREP BY PCR - Abnormal; Notable for the following components:   Group A Strep by PCR DETECTED (*)    All other components within normal limits    EKG   Radiology No results found.  Procedures Procedures (including critical care time)  Medications Ordered in UC Medications - No data to display  Initial Impression / Assessment and Plan / UC Course  I have reviewed the triage vital signs and the nursing notes.  Pertinent labs & imaging results that were available during my care of the patient were reviewed by me and considered in my medical decision making (see chart for details).   Patient is a pleasant, nontoxic-appearing 25 old female here for evaluation of flulike symptoms that began 3 days ago as outlined in HPI above.  Her physical exam reveals pearly-gray tympanic membranes bilaterally with normal light reflex and clear external auditory canals.  Nasal mucosa is erythematous and edematous with copious clear discharge in both nares.  Oropharyngeal exam reveals edematous and erythematous tonsillar pillars bilaterally.  No exudate  appreciated.  Posterior pharynx is erythematous with clear postnasal drip.  No anterior cervical of adenopathy appreciated exam.  Cardiopulmonary exam reveals S1-S2 heart sounds with regular rate and rhythm and lung sounds are clear to auscultation all fields.  Given patient's COVID and flu exposure I will test her for both as well as send a strep PCR.  Strep PCR is positive.  COVID PCR is positive.  I will discharge patient home with a diagnosis of COVID and strep and treat her with molnupiravir, Augmentin, Atrovent nasal spray, Tessalon Perles, and Promethazine DM cough syrup.  Work note provided to cover for the quarantine duration.   Final Clinical Impressions(s) / UC Diagnoses   Final diagnoses:  COVID-19  Strep pharyngitis     Discharge Instructions      You have tested positive for both strep and COVID today.  Take the Augmentin twice daily for 7 days for treatment of your strep throat.  Gargle with warm salt water 2-3 times a day to soothe your throat, aid in pain relief, and aid in healing.  Take over-the-counter ibuprofen according to the package instructions as needed for pain.  You can also use Chloraseptic or Sucrets lozenges, 1 lozenge every 2 hours as needed for throat pain.  You will need to quarantine for 5 days from onset of your symptoms due to being COVID-positive.  After day  5 you can break quarantine if your symptoms have improved and you have not had a fever for 24 hours let Tylenol and ibuprofen.  Use Tylenol and ibuprofen according to the package instructions as needed for fever and pain.  Take molnupiravir twice daily for 5 days for treatment of COVID-19.  Use the Atrovent nasal spray, 2 squirts in each nostril every 6 hours, as needed for runny nose and postnasal drip.  Use the Tessalon Perles every 8 hours during the day.  Take them with a small sip of water.  They may give you some numbness to the base of your tongue or a metallic taste in your  mouth, this is normal.  Use the Promethazine DM cough syrup at bedtime for cough and congestion.  It will make you drowsy so do not take it during the day.  If you develop shortness of breath, especially at rest, feel you cannot catch her breath, you cannot speak in full sentences, or late sign is your lips start turning blue you need to call 911 and go to the ER for evaluation.     ED Prescriptions     Medication Sig Dispense Auth. Provider   amoxicillin-clavulanate (AUGMENTIN) 875-125 MG tablet Take 1 tablet by mouth every 12 (twelve) hours for 10 days. 20 tablet Becky Augusta, NP   benzonatate (TESSALON) 100 MG capsule Take 2 capsules (200 mg total) by mouth every 8 (eight) hours. 21 capsule Becky Augusta, NP   ipratropium (ATROVENT) 0.06 % nasal spray Place 2 sprays into both nostrils 4 (four) times daily. 15 mL Becky Augusta, NP   molnupiravir EUA (LAGEVRIO) 200 mg CAPS capsule Take 4 capsules (800 mg total) by mouth 2 (two) times daily for 5 days. 40 capsule Becky Augusta, NP   promethazine-dextromethorphan (PROMETHAZINE-DM) 6.25-15 MG/5ML syrup Take 5 mLs by mouth 4 (four) times daily as needed. 118 mL Becky Augusta, NP      PDMP not reviewed this encounter.   Becky Augusta, NP 09/11/21 [phone]

## 2021-12-10 ENCOUNTER — Ambulatory Visit
Admission: EM | Admit: 2021-12-10 | Discharge: 2021-12-10 | Disposition: A | Discharge status: Home / Self Care | Payer: Medicaid Other | Attending: Emergency Medicine | Admitting: Emergency Medicine

## 2021-12-10 DIAGNOSIS — J020 Streptococcal pharyngitis: Secondary | ICD-10-CM | POA: Insufficient documentation

## 2021-12-10 LAB — GROUP A STREP BY PCR: Group A Strep by PCR: DETECTED — AB

## 2021-12-10 MED ORDER — FLUTICASONE PROPIONATE 50 MCG/ACT NA SUSP: 2.0000 | Freq: Every day | NASAL | 0 refills | Status: AC

## 2021-12-10 MED ORDER — PENICILLIN V POTASSIUM 500 MG PO TABS: 500.0000 mg | ORAL_TABLET | Freq: Two times a day (BID) | ORAL | 0 refills | Status: AC

## 2021-12-10 MED ORDER — IBUPROFEN 600 MG PO TABS: 600.0000 mg | ORAL_TABLET | Freq: Four times a day (QID) | ORAL | 0 refills | Status: AC | PRN

## 2021-12-10 NOTE — Discharge Instructions (Signed)
Finish the penicillin, even if you feel better.  Mucinex D, saline nasal irrigation with a Lloyd Huger Med rinse and distilled water as often as you want and Flonase or Atrovent nasal spray for the nasal congestion.  You can use Afrin at night, but do not use it for more than 3 days.  1 gram of Tylenol and 600 mg ibuprofen together 3-4 times a day as needed for pain.  Make sure you drink plenty of extra fluids.  Some people find salt water gargles and  Traditional Medicinal's "Throat Coat" tea helpful. Take 5 mL of liquid Benadryl and 5 mL of Maalox. Mix it together, and then hold it in your mouth for as long as you can and then swallow. You may do this 4 times a day.  Honey and lemon dissolved in hot water can also be soothing.  Go to www.goodrx.com  or www.costplusdrugs.com to look up your medications. This will give you a list of where you can find your prescriptions at the most affordable prices. Or ask the pharmacist what the cash price is, or if they have any other discount programs available to help make your medication more affordable. This can be less expensive than what you would pay with insurance.

## 2021-12-10 NOTE — ED Triage Notes (Signed)
Pt c/o possible strep.  Pt states that she has had throat pain x2days and ear pain x1day

## 2021-12-10 NOTE — ED Provider Notes (Signed)
HPI  SUBJECTIVE:  Patient reports sore throat starting 2 days ago.  Sx worse with swallowing.  Sx better with sleeping.  Has not tried anything for her symptoms No fever  + Fatigue No neck stiffness  +Cough productive of yellowish material, no wheezing + Unable to sleep at night because of the cough.   + nasal congestion, yellow rhinorrhea, postnasal drip No Myalgias No Headache No Rash  No loss of taste or smell No shortness of breath or difficulty breathing No nausea, vomiting No diarrhea No abdominal pain     No Recent Strep, flu, COVID exposure No reflux sxs No Allergy sxs  No Breathing difficulty, voice changes, sensation of throat swelling shut No Drooling No Trismus No abx in past month.  No antipyretic in past 6 hrs  Patient has a past medical history of frequent strep-states that she gets up 4-5 times a year, has never been evaluated by ENT.  She had COVID in September 23.   LMP: Has an IUD.  Denies the possibility of being pregnant.   PCP: She is establishing care at [facility] [place] in January   Past Medical History:  Diagnosis Date   Anemia    Anxiety    Depression     Past Surgical History:  Procedure Laterality Date   hip reconstruction Bilateral    R) at 26 yrs old and L) at 62   HIP SURGERY Right     Family History  Problem Relation Age of Onset   Healthy Mother    Heart attack Father 30   Diabetes Maternal Grandmother    Heart disease Maternal Grandmother    Stroke Maternal Grandfather    Heart disease Brother     Social History   Tobacco Use   Smoking status: Some Days    Types: Cigarettes, E-cigarettes    Last attempt to quit: 07/15/2017    Years since quitting: 4.4   Smokeless tobacco: Never  Vaping Use   Vaping Use: Some days  Substance Use Topics   Alcohol use: Not Currently    Comment: occ   Drug use: Not Currently    Types: Marijuana, Cocaine    No current facility-administered medications for this  encounter.  Current Outpatient Medications:    fluticasone (FLONASE) 50 MCG/ACT nasal spray, Place 2 sprays into both nostrils daily., Disp: 16 g, Rfl: 0   ibuprofen (ADVIL) 600 MG tablet, Take 1 tablet (600 mg total) by mouth every 6 (six) hours as needed., Disp: 30 tablet, Rfl: 0   penicillin v potassium (VEETID) 500 MG tablet, Take 1 tablet (500 mg total) by mouth 2 (two) times daily for 10 days. X 10 days, Disp: 20 tablet, Rfl: 0   ipratropium (ATROVENT) 0.06 % nasal spray, Place 2 sprays into both nostrils 4 (four) times daily., Disp: 15 mL, Rfl: 12  Allergies  Allergen Reactions   Abilify [Aripiprazole]      ROS  As noted in HPI.   Physical Exam  BP 127/86 (BP Location: Left Arm)   Pulse 73   Temp 98.4 F (36.9 C) (Oral)   Resp 18   Ht 5\' 5"  (1.651 m)   Wt 88 kg   LMP  (LMP Unknown)   SpO2 100%   BMI 32.28 kg/m   Constitutional: Well developed, well nourished, no acute distress Eyes:  EOMI, conjunctiva normal bilaterally HENT: Normocephalic, atraumatic,mucus membranes moist.  Positive nasal congestion.  Erythematous oropharynx, slightly enlarged erythematous tonsils without exudates.  Uvula midline.  No postnasal drip.   Respiratory: Normal inspiratory effort Cardiovascular: Normal rate, no murmurs, rubs, gallops GI: nondistended, nontender. No appreciable splenomegaly skin: No rash, skin intact Lymph: Positive anterior cervical LN.  No posterior cervical lymphadenopathy Musculoskeletal: no deformities Neurologic: Alert & oriented x 3, no focal neuro deficits Psychiatric: Speech and behavior appropriate.  ED Course   Medications - No data to display  Orders Placed This Encounter  Procedures   Group A Strep by PCR    Standing Status:   Standing    Number of Occurrences:   1   Ambulatory referral to ENT    Referral Priority:   Routine    Referral Type:   Consultation    Referral Reason:   Specialty Services Required    Referred to Provider:   Linus Salmons, MD    Requested Specialty:   Otolaryngology    Number of Visits Requested:   1    Results for orders placed or performed during the hospital encounter of 12/10/21 (from the past 24 hour(s))  Group A Strep by PCR     Status: Abnormal   Collection Time: 12/10/21  1:26 PM   Specimen: Throat; Sterile Swab  Result Value Ref Range   Group A Strep by PCR DETECTED (A) NOT DETECTED   No results found.  ED Clinical Impression  1. Strep pharyngitis      ED Assessment/Plan    strep PCR positive. Sending home with penicillin for 10 days. Home with ibuprofen, Tylenol, Benadryl/Maalox mixture, Flonase, Promethazine DM, saline nasal irrigation.  Will refer to ENT because she gets strep throat 4-5 times a year.. Patient to followup with PCP as needed.  Discussed labs,  MDM, plan and followup with patient. Discussed sn/sx that should prompt return to the ED. patient agrees with plan.   Meds ordered this encounter  Medications   penicillin v potassium (VEETID) 500 MG tablet    Sig: Take 1 tablet (500 mg total) by mouth 2 (two) times daily for 10 days. X 10 days    Dispense:  20 tablet    Refill:  0   ibuprofen (ADVIL) 600 MG tablet    Sig: Take 1 tablet (600 mg total) by mouth every 6 (six) hours as needed.    Dispense:  30 tablet    Refill:  0   fluticasone (FLONASE) 50 MCG/ACT nasal spray    Sig: Place 2 sprays into both nostrils daily.    Dispense:  16 g    Refill:  0     *This clinic note was created using Scientist, clinical (histocompatibility and immunogenetics). Therefore, there may be occasional mistakes despite careful proofreading.     Domenick Gong, MD 12/10/21 1443

## 2022-02-12 ENCOUNTER — Other Ambulatory Visit: Payer: Self-pay

## 2022-02-12 NOTE — Discharge Instructions (Signed)
T & A INSTRUCTION SHEET - [facility]  CREIGHTON VAUGHT, MD   INFORMATION SHEET FOR A TONSILLECTOMY AND ADENDOIDECTOMY  About Your Tonsils and Adenoids  The tonsils and adenoids are normal body tissues that are part of our immune system.  They normally help to protect us against diseases that may enter our mouth and nose. However, sometimes the tonsils and/or adenoids become too large and obstruct our breathing, especially at night.    If either of these things happen it helps to remove the tonsils and adenoids in order to become healthier. The operation to remove the tonsils and adenoids is called a tonsillectomy and adenoidectomy.  The Location of Your Tonsils and Adenoids  The tonsils are located in the back of the throat on both side and sit in a cradle of muscles. The adenoids are located in the roof of the mouth, behind the nose, and closely associated with the opening of the Eustachian tube to the ear.  Surgery on Tonsils and Adenoids  A tonsillectomy and adenoidectomy is a short operation which takes about thirty minutes.  This includes being put to sleep and being awakened. Tonsillectomies and adenoidectomies are performed at [facility] and may require observation period in the recovery room prior to going home. Children are required to remain in recovery for at least 45 minutes.   Following the Operation for a Tonsillectomy  A cautery machine is used to control bleeding. Bleeding from a tonsillectomy and adenoidectomy is minimal and postoperatively the risk of bleeding is approximately four percent, although this rarely life threatening.  After your tonsillectomy and adenoidectomy post-op care at home: 1. Our patients are able to go home the same day. You may be given prescriptions for pain medications, if indicated. 2. It is extremely important to remember that fluid intake is of utmost importance after a tonsillectomy. The  amount that you drink must be maintained in the postoperative period. A good indication of whether a child is getting enough fluid is whether his/her urine output is constant. As long as children are urinating or wetting their diaper every 6 - 8 hours this is usually enough fluid intake.   3. Although rare, this is a risk of some bleeding in the first ten days after surgery. This usually occurs between day five and nine postoperatively. This risk of bleeding is approximately four percent. If you or your child should have any bleeding you should remain calm and notify our office or go directly to the emergency room at [facility] where they will contact us. Our doctors are available seven days a week for notification. We recommend sitting up quietly in a chair, place an ice pack on the front of the neck and spitting out the blood gently until we are able to contact you. Adults should gargle gently with ice water and this may help stop the bleeding. If the bleeding does not stop after a short time, i.e. 10 to 15 minutes, or seems to be increasing again, please contact us or go to the hospital.   4. It is common for the pain to be worse at 5 - 7 days postoperatively. This occurs because the "scab" is peeling off and the mucous membrane (skin of the throat) is growing back where the tonsils were.   5. It is common for a low-grade fever, less than 102, during the first week after a tonsillectomy and adenoidectomy. It is usually due to not   drinking enough liquids, and we suggest your use liquid Tylenol (acetaminophen) or the pain medicine with Tylenol (acetaminophen) prescribed in order to keep your temperature below 102. Please follow the directions on the back of the bottle. 6. Recommendations for post-operative pain in children and adults: a) For Children 12 and younger: Recommendations are for oral Tylenol (acetaminophen) and oral Motrin (Ibuprofen) along with a prescription dose of  Prednisolone which is a steroid to help with pain and swelling. Administer the Tylenol (acetaminophen) and Motrin as stated on bottle for patient's age/weight. Sometimes it may be necessary to alternate the Tylenol (acetaminophen) and Motrin for improved pain control. Motrin does last slightly longer so many patients benefit from being given this prior to bedtime. All children should avoid Aspirin products for 2 weeks following surgery. b) For children over the age of 12: Tylenol (acetaminophen) is the preferred first choice for pain control. Depending on your child's size, sometimes they will be given a combination of Tylenol (acetaminophen) and hydrocodone medication or sometimes it will be recommended they take Motrin (ibuprofen) in addition to the Tylenol (acetaminophen). Narcotics should always be used with caution in children following surgery as they can suppress their breathing and switching to over the counter Tylenol (acetaminophen) and Motrin (ibuprofen) as soon as possible is recommended. All patients should avoid Aspirin products for 2 weeks following surgery. c) Adults: Usually adults will require a narcotic pain medication following a tonsillectomy. This usually has either hydrocodone or oxycodone in it and can usually be taken every 4 to 6 hours as needed for moderate pain. If the medication does not have Tylenol (acetaminophen) in it, you may also supplement Tylenol (acetaminophen) as needed every 4 to 6 hours for breakthrough or mild pain. Adults are also given Viscous Lidocaine to swish and spit every 6 hours to help with topical pain. Adults should avoid Aspirin, Aleve, Motrin, and Ibuprofen products for 2 weeks following surgery as they can increase your risk of bleeding. 7. If you happen to look in the mirror or into your child's mouth you will see white/gray patches on the back of the throat. This is what a scab looks like in the mouth and is normal after having a tonsillectomy and  adenoidectomy. They will disappear once the tonsil areas heal completely. However, it may cause a noticeable odor, and this too will disappear with time.     8. You or your child may experience ear pain after having a tonsillectomy and adenoidectomy.  This is called referred pain and comes from the throat, but it is felt in the ears.  Ear pain is quite common and expected. It will usually go away after ten days. There is usually nothing wrong with the ears, and it is primarily due to the healing area stimulating the nerve to the ear that runs along the side of the throat. Use either the prescribed pain medicine or Tylenol (acetaminophen) as needed.  9. The throat tissues after a tonsillectomy are obviously sensitive. Smoking around children who have had a tonsillectomy significantly increases the risk of bleeding. DO NOT SMOKE!  What to Expect Each Day  First Day at Home 1. Patients will be discharged home the same day.  2. Drink at least four glasses of liquid a day. Clear, cool liquids are recommended. Fruit juices containing citric acid are not recommended because they tend to cause pain. Carbonated beverages are allowed if you pour them from glass to glass to remove the bubbles as these tend to cause   discomfort. Avoid alcoholic beverages.  3. Eat very soft foods such as soups, broth, jello, custard, pudding, ice cream, popsicles, applesauce, mashed potatoes, and in general anything that you can crush between your tongue and the roof of your mouth. Try adding Carnation Instant Breakfast Mix into your food for extra calories. It is not uncommon to lose 5 to 10 pounds of fluid weight. The weight will be gained back quickly once you're feeling better and drinking more.  4. Sleep with your head elevated on two pillows for about three days to help decrease the swelling.  5. DO NOT SMOKE!  Day Two  1. Rest as much as possible. Use common sense in your activities.  2. Continue drinking at least four glasses  of liquid per day.  3. Follow the soft diet.  4. Use your pain medication as needed.  Day Three  1. Advance your activity as you are able and continue to follow the previous day's suggestions.  Days Four Through Six  1. Advance your diet and begin to eat more solid foods such as chopped hamburger. 2. Advance your activities slowly. Children should be kept mostly around the house.  3. Not uncommonly, there will be more pain at this time. It is temporary, usually lasting a day or two.  Day Seven Through Ten  1. Most individuals by this time are able to return to work or school unless otherwise instructed. Consider sending children back to school for a half day on the first day back. 

## 2022-02-13 ENCOUNTER — Encounter: Payer: Self-pay | Admitting: Otolaryngology

## 2022-02-19 ENCOUNTER — Ambulatory Visit: Admit: 2022-02-19 | Payer: Medicaid Other | Admitting: Otolaryngology

## 2022-02-19 HISTORY — DX: Encounter for supervision of normal pregnancy, unspecified, unspecified trimester: Z34.90

## 2022-02-19 SURGERY — TONSILLECTOMY
Anesthesia: General | Laterality: Bilateral

## 2022-02-20 DIAGNOSIS — O2340 Unspecified infection of urinary tract in pregnancy, unspecified trimester: Secondary | ICD-10-CM | POA: Insufficient documentation

## 2022-02-20 DIAGNOSIS — O0990 Supervision of high risk pregnancy, unspecified, unspecified trimester: Secondary | ICD-10-CM | POA: Insufficient documentation

## 2022-02-28 LAB — PANORAMA PRENATAL TEST FULL PANEL:PANORAMA TEST PLUS 5 ADDITIONAL MICRODELETIONS: FETAL FRACTION: 5.7

## 2022-03-04 DIAGNOSIS — G932 Benign intracranial hypertension: Secondary | ICD-10-CM | POA: Insufficient documentation

## 2022-03-04 DIAGNOSIS — R87612 Low grade squamous intraepithelial lesion on cytologic smear of cervix (LGSIL): Secondary | ICD-10-CM | POA: Insufficient documentation

## 2022-05-19 DIAGNOSIS — D509 Iron deficiency anemia, unspecified: Secondary | ICD-10-CM | POA: Insufficient documentation

## 2022-06-17 DIAGNOSIS — Z3490 Encounter for supervision of normal pregnancy, unspecified, unspecified trimester: Secondary | ICD-10-CM | POA: Insufficient documentation

## 2022-08-08 DIAGNOSIS — O99820 Streptococcus B carrier state complicating pregnancy: Secondary | ICD-10-CM | POA: Insufficient documentation

## 2022-09-12 ENCOUNTER — Encounter: Payer: Self-pay | Admitting: Anesthesiology

## 2022-09-12 NOTE — Progress Notes (Signed)
Discussed today with Dr. Andee Poles. Patient anemic and is 3 weeks postpartum. Mutually agreed will allow optimization of anemia and further distance from postpartum state, prior to general anesthesia, ETT and T & A procedure.  Patient to be rescheduled. Jocelyn Lamer MD [phone]

## 2022-09-30 ENCOUNTER — Ambulatory Visit
Admission: EM | Admit: 2022-09-30 | Discharge: 2022-09-30 | Disposition: A | Discharge status: Home / Self Care | Payer: Medicaid Other | Attending: Emergency Medicine | Admitting: Emergency Medicine

## 2022-09-30 DIAGNOSIS — J029 Acute pharyngitis, unspecified: Secondary | ICD-10-CM | POA: Insufficient documentation

## 2022-09-30 DIAGNOSIS — B349 Viral infection, unspecified: Secondary | ICD-10-CM | POA: Diagnosis present

## 2022-09-30 LAB — GROUP A STREP BY PCR: Group A Strep by PCR: NOT DETECTED

## 2022-09-30 NOTE — ED Triage Notes (Signed)
Patient presents to UC for sore throat since last night, hx of strep infections. Not taking any OTC meds.

## 2022-09-30 NOTE — ED Provider Notes (Signed)
[facility]    CSN: [id number] Arrival date & time: 09/30/22  0849      History   Chief Complaint Chief Complaint  Patient presents with   Sore Throat    HPI Mckenzie Obrien is a 27 y.o. female.   27 year old female pt, Mckenzie Obrien, presents to urgent care for evaluation of sore throat x 1 day, hx of strep infections. No meds taken PTA.  The history is provided by the patient. No language interpreter was used.    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 01/24/2019   Insomnia 01/24/2019   Postpartum anxiety 01/24/2019   Chronic gastric ulcer without hemorrhage and without perforation 11/21/2018   IUD (intrauterine device) in place 09/17/2018   Postpartum depression (CODE) 07/13/2018   History of substance abuse (HCC) 01/21/2018   Mild tetrahydrocannabinol (THC) abuse 10/25/2017   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   Anemia 08/06/2015   History of pneumonia 07/30/2015   Obesity (BMI 30.0-34.9) 07/30/2015   Acquired deformity of hip, left 05/15/2015   History of depression 07/19/2013   History of marijuana use 07/19/2013   History of migraine 07/19/2013    Past Surgical History:  Procedure Laterality Date   hip reconstruction Bilateral    R) at 27 yrs old and L) at 58   HIP SURGERY Right     OB History     Gravida  1   Para      Term      Preterm      AB      Living         SAB      IAB      Ectopic      Multiple      Live Births               Home Medications    Prior to Admission medications   Medication Sig Start Date End Date Taking? Authorizing Provider  fluticasone (FLONASE) 50 MCG/ACT nasal spray Place 2 sprays into both  nostrils daily. 12/10/21   Domenick Gong, MD  ibuprofen (ADVIL) 600 MG tablet Take 1 tablet (600 mg total) by mouth every 6 (six) hours as needed. 12/10/21   Domenick Gong, MD  ipratropium (ATROVENT) 0.06 % nasal spray Place 2 sprays into both nostrils 4 (four) times daily. 09/11/21   Becky Augusta, NP    Family History Family History  Problem Relation Age of Onset   Healthy Mother    Heart attack Father 63   Diabetes Maternal Grandmother    Heart disease Maternal Grandmother    Stroke Maternal Grandfather    Heart disease Brother     Social History Social History   Tobacco Use   Smoking status: Some Days    Current packs/day: 0.00    Types: Cigarettes, E-cigarettes    Last attempt to quit: 07/15/2017    Years since quitting: 5.2   Smokeless tobacco: Never  Vaping Use   Vaping status: Some Days  Substance Use Topics   Alcohol use: Not Currently    Comment: occ   Drug use: Not Currently    Types: Marijuana, Cocaine  Allergies   Abilify [aripiprazole]   Review of Systems Review of Systems  Constitutional:  Negative for fever.  HENT:  Positive for sore throat.   All other systems reviewed and are negative.    Physical Exam Triage Vital Signs ED Triage Vitals [09/30/22 0919]  Encounter Vitals Group     BP 122/79     Systolic BP Percentile      Diastolic BP Percentile      Pulse Rate 68     Resp 16     Temp 98.5 F (36.9 C)     Temp Source Oral     SpO2 98 %     Weight      Height      Head Circumference      Peak Flow      Pain Score 2     Pain Loc      Pain Education      Exclude from Growth Chart    No data found.  Updated Vital Signs BP 122/79 (BP Location: Left Arm)   Pulse 68   Temp 98.5 F (36.9 C) (Oral)   Resp 16   SpO2 98%   Visual Acuity Right Eye Distance:   Left Eye Distance:   Bilateral Distance:    Right Eye Near:   Left Eye Near:    Bilateral Near:     Physical Exam Vitals and nursing note reviewed.   Constitutional:      General: She is not in acute distress.    Appearance: She is well-developed.  HENT:     Head: Normocephalic and atraumatic.     Right Ear: Tympanic membrane is retracted.     Left Ear: Tympanic membrane is retracted.     Nose: Congestion present.     Mouth/Throat:     Lips: Pink.     Mouth: Mucous membranes are moist.     Pharynx: Uvula midline. Posterior oropharyngeal erythema present. No oropharyngeal exudate or uvula swelling.     Tonsils: No tonsillar exudate or tonsillar abscesses.  Eyes:     Conjunctiva/sclera: Conjunctivae normal.  Cardiovascular:     Rate and Rhythm: Normal rate and regular rhythm.     Heart sounds: Normal heart sounds. No murmur heard. Pulmonary:     Effort: Pulmonary effort is normal. No respiratory distress.     Breath sounds: Normal breath sounds and air entry.  Abdominal:     Palpations: Abdomen is soft.     Tenderness: There is no abdominal tenderness.  Musculoskeletal:        General: No swelling.     Cervical back: Neck supple.  Skin:    General: Skin is warm and dry.     Capillary Refill: Capillary refill takes less than 2 seconds.  Neurological:     General: No focal deficit present.     Mental Status: She is alert and oriented to person, place, and time.     GCS: GCS eye subscore is 4. GCS verbal subscore is 5. GCS motor subscore is 6.  Psychiatric:        Attention and Perception: Attention normal.        Mood and Affect: Mood normal.        Speech: Speech normal.        Behavior: Behavior normal.      UC Treatments / Results  Labs (all labs ordered are listed, but only abnormal results are displayed) Labs Reviewed  GROUP A STREP BY PCR  EKG   Radiology No results found.  Procedures Procedures (including critical care time)  Medications Ordered in UC Medications - No data to display  Initial Impression / Assessment and Plan / UC Course  I have reviewed the triage vital signs and the nursing  notes.  Pertinent labs & imaging results that were available during my care of the patient were reviewed by me and considered in my medical decision making (see chart for details).     Ddx: Pharyngitis, viral illness, allergies, GERD Final Clinical Impressions(s) / UC Diagnoses   Final diagnoses:  Viral pharyngitis  Nonspecific syndrome suggestive of viral illness     Discharge Instructions      Strep is negative. Most likely you have a viral illness: no antibiotic as indicated at this time, May treat with OTC meds of choice. Make sure to drink plenty of fluids to stay hydrated(gatorade, water, popsicles,jello,etc), avoid caffeine products. Follow up with PCP. Return as needed.     ED Prescriptions   None    PDMP not reviewed this encounter.   Clancy Gourd, NP 09/30/22 [phone]

## 2022-09-30 NOTE — Discharge Instructions (Addendum)
Strep is negative. Most likely you have a viral illness: no antibiotic as indicated at this time, May treat with OTC meds of choice. Make sure to drink plenty of fluids to stay hydrated(gatorade, water, popsicles,jello,etc), avoid caffeine products. Follow up with PCP. Return as needed.

## 2022-11-19 ENCOUNTER — Ambulatory Visit: Admit: 2022-11-19 | Payer: Medicaid Other | Admitting: Otolaryngology

## 2022-11-19 SURGERY — TONSILLECTOMY
Anesthesia: General | Laterality: Bilateral

## 2023-04-22 ENCOUNTER — Ambulatory Visit
Admission: EM | Admit: 2023-04-22 | Discharge: 2023-04-22 | Disposition: A | Discharge status: Home / Self Care | Attending: Family Medicine | Admitting: Family Medicine

## 2023-04-22 DIAGNOSIS — J029 Acute pharyngitis, unspecified: Secondary | ICD-10-CM | POA: Insufficient documentation

## 2023-04-22 LAB — GROUP A STREP BY PCR: Group A Strep by PCR: NOT DETECTED

## 2023-04-22 NOTE — Discharge Instructions (Signed)
 Mckenzie Obrien's strep test is negative.    You can take Tylenol and/or Ibuprofen as needed for fever reduction and pain relief.   For sore throat: try warm salt water gargles, Mucinex sore throat cough drops or cepacol lozenges, throat spray, warm tea or water with lemon/honey, popsicles or ice, or OTC cold relief medicine for throat discomfort. You can also purchase chloraseptic spray at the pharmacy or dollar store.   It is important to stay hydrated: drink plenty of fluids (water, gatorade/powerade/pedialyte, juices, or teas) to keep your throat moisturized and help further relieve irritation/discomfort.    Return or go to the Emergency Department if symptoms worsen or do not improve in the next few days

## 2023-04-22 NOTE — ED Provider Notes (Signed)
 [facility]    CSN: [id number] Arrival date & time: 04/22/23  0908      History   Chief Complaint Chief Complaint  Patient presents with   Sore Throat    HPI Mckenzie Obrien is a 28 y.o. female.   HPI  History obtained from the patient. [name] presents for sore throat and "angry tonsils" that she noticed yesterday. She was supposed to have her tonsils removed but couldn't she was pregnant with her son and her iron levels were low.  Has slight cough. No fever, vomiting or diarrhea. Took nothing for her symptoms thus far.       Past Medical History:  Diagnosis Date   Anemia    Anxiety    Depression    Pregnant     Patient Active Problem List   Diagnosis Date Noted   Viral pharyngitis 09/30/2022   Nonspecific syndrome suggestive of viral illness 09/30/2022   GBS (group B Streptococcus carrier), +RV culture, currently pregnant 08/08/2022   Encounter for induction of labor 06/17/2022   Iron deficiency anemia 05/19/2022   Intracranial hypertension 03/04/2022   Papanicolaou smear of cervix with low grade squamous intraepithelial lesion (LGSIL) 03/04/2022   Supervision of high risk pregnancy, antepartum 02/20/2022   UTI in pregnancy, antepartum 02/20/2022   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 01/24/2019   Insomnia 01/24/2019   Postpartum anxiety 01/24/2019   Chronic gastric ulcer without hemorrhage and without perforation 11/21/2018   IUD (intrauterine device) in place 09/17/2018   Hip instability, left 08/31/2018   Postpartum depression (CODE) 07/13/2018   History of substance abuse (HCC) 01/21/2018   Mild tetrahydrocannabinol (THC) abuse 10/25/2017   Susceptible to varicella (non-immune), currently pregnant 10/25/2017   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   Seasonal affective disorder (HCC) 12/25/2015   Anemia 08/06/2015   Status post hip surgery 08/06/2015   History of pneumonia 07/30/2015   Obesity in  pregnancy, antepartum 07/30/2015   Mechanical low back pain 02/22/2015   Tear of right acetabular labrum 01/25/2015   Hip pain 01/25/2015   PUPP (pruritic urticarial papules and plaques of pregnancy) 08/05/2013   History of postpartum depression 07/19/2013   History of marijuana use 07/19/2013   History of migraine 07/19/2013   Former smoker 07/19/2013    Past Surgical History:  Procedure Laterality Date   hip reconstruction Bilateral    R) at 28 yrs old and L) at 4   HIP SURGERY Right     OB History     Gravida  1   Para      Term      Preterm      AB      Living         SAB      IAB      Ectopic      Multiple      Live Births               Home Medications    Prior to Admission medications   Medication Sig Start Date End Date Taking? Authorizing Provider  Docusate Sodium (DSS) 100 MG CAPS Take 1 capsule by mouth daily.   Yes [provider]  metFORMIN (GLUCOPHAGE-XR) 500 MG 24 hr tablet Take 500 mg by mouth 2 (two) times daily.   Yes [provider]  fluticasone (FLONASE) 50 MCG/ACT nasal spray Place 2 sprays into both nostrils daily. 12/10/21   Mortenson, Ashley, MD  ibuprofen (ADVIL) 600  MG tablet Take 1 tablet (600 mg total) by mouth every 6 (six) hours as needed. 12/10/21   Mortenson, Ashley, MD  ipratropium (ATROVENT) 0.06 % nasal spray Place 2 sprays into both nostrils 4 (four) times daily. 09/11/21   Kent Pear, NP    Family History Family History  Problem Relation Age of Onset   Healthy Mother    Heart attack Father 9   Diabetes Maternal Grandmother    Heart disease Maternal Grandmother    Stroke Maternal Grandfather    Heart disease Brother     Social History Social History   Tobacco Use   Smoking status: Some Days    Current packs/day: 0.00    Types: Cigarettes, E-cigarettes    Last attempt to quit: 07/15/2017    Years since quitting: 5.7   Smokeless tobacco: Never  Vaping Use   Vaping status: Some Days   Substance Use Topics   Alcohol use: Not Currently    Comment: occ   Drug use: Not Currently    Types: Marijuana, Cocaine     Allergies   Abilify [aripiprazole]   Review of Systems Review of Systems: negative unless otherwise stated in HPI.      Physical Exam Triage Vital Signs ED Triage Vitals  Encounter Vitals Group     BP 04/22/23 0915 120/79     Systolic BP Percentile --      Diastolic BP Percentile --      Pulse Rate 04/22/23 0915 84     Resp 04/22/23 0915 16     Temp 04/22/23 0915 98.4 F (36.9 C)     Temp Source 04/22/23 0915 Oral     SpO2 04/22/23 0915 95 %     Weight 04/22/23 0914 190 lb (86.2 kg)     Height 04/22/23 0914 5\' 7"  (1.702 m)     Head Circumference --      Peak Flow --      Pain Score 04/22/23 0918 4     Pain Loc --      Pain Education --      Exclude from Growth Chart --    No data found.  Updated Vital Signs BP 120/79 (BP Location: Left Arm)   Pulse 84   Temp 98.4 F (36.9 C) (Oral)   Resp 16   Ht 5\' 7"  (1.702 m)   Wt 86.2 kg   SpO2 95%   BMI 29.76 kg/m   Visual Acuity Right Eye Distance:   Left Eye Distance:   Bilateral Distance:    Right Eye Near:   Left Eye Near:    Bilateral Near:     Physical Exam GEN:     alert, non-toxic appearing female in no distress    HENT:  mucus membranes moist, oropharyngeal without lesions, moderate erythema, + tonsillar hypertrophy but no exudates, no nasal discharge, EYES:   no scleral injection or discharge NECK:  normal ROM, no lymphadenopathy, no meningismus   RESP:  no increased work of breathing, clear to auscultation bilaterally CVS:   regular rate and rhythm Skin:   warm and dry    UC Treatments / Results  Labs (all labs ordered are listed, but only abnormal results are displayed) Labs Reviewed  GROUP A STREP BY PCR    EKG   Radiology No results found.  Procedures Procedures (including critical care time)  Medications Ordered in UC Medications - No data to  display  Initial Impression / Assessment and Plan / UC Course  I  have reviewed the triage vital signs and the nursing notes.  Pertinent labs & imaging results that were available during my care of the patient were reviewed by me and considered in my medical decision making (see chart for details).       Pt is a 28 y.o. female who presents for 1 day of sore throat.  Sanela is afebrile here without recent antipyretics. Satting well on room air. Overall pt is non-toxic appearing, well hydrated, without respiratory distress.  Throat exam is remarkable for erythema but no tonsillar exudates or hypertrophy.  Strep PCR was negative. History consistent with viral respiratory illness. Discussed symptomatic treatment.  Explained lack of efficacy of antibiotics in viral disease.  Typical duration of symptoms discussed.   Return and ED precautions given and voiced understanding. Discussed MDM, treatment plan and plan for follow-up with patient who agrees with plan.     Final Clinical Impressions(s) / UC Diagnoses   Final diagnoses:  Acute pharyngitis, unspecified etiology     Discharge Instructions      Merryl Buckels Mantz's strep test is negative.    You can take Tylenol and/or Ibuprofen as needed for fever reduction and pain relief.   For sore throat: try warm salt water gargles, Mucinex sore throat cough drops or cepacol lozenges, throat spray, warm tea or water with lemon/honey, popsicles or ice, or OTC cold relief medicine for throat discomfort. You can also purchase chloraseptic spray at the pharmacy or dollar store.   It is important to stay hydrated: drink plenty of fluids (water, gatorade/powerade/pedialyte, juices, or teas) to keep your throat moisturized and help further relieve irritation/discomfort.    Return or go to the Emergency Department if symptoms worsen or do not improve in the next few days       ED Prescriptions   None    PDMP not reviewed this  encounter.   Kashmir Lysaght, DO 04/29/23 1818

## 2023-04-22 NOTE — ED Triage Notes (Signed)
 Pt c/o sore throat x1 day. Hx of recurrent strep.
